# Patient Record
Sex: Female | Born: 1960 | Race: White | Hispanic: No | Marital: Single | Smoking: Never smoker
Health system: Southern US, Community
[De-identification: ages and names within clinical notes are randomized; demographics above are authoritative.]

## PROBLEM LIST (undated history)

## (undated) DIAGNOSIS — O2230 Deep phlebothrombosis in pregnancy, unspecified trimester: Secondary | ICD-10-CM

## (undated) DIAGNOSIS — R42 Dizziness and giddiness: Secondary | ICD-10-CM

## (undated) DIAGNOSIS — R569 Unspecified convulsions: Secondary | ICD-10-CM

## (undated) DIAGNOSIS — E119 Type 2 diabetes mellitus without complications: Secondary | ICD-10-CM

## (undated) DIAGNOSIS — I10 Essential (primary) hypertension: Secondary | ICD-10-CM

## (undated) DIAGNOSIS — G43909 Migraine, unspecified, not intractable, without status migrainosus: Secondary | ICD-10-CM

---

## 2003-01-22 DIAGNOSIS — O2230 Deep phlebothrombosis in pregnancy, unspecified trimester: Secondary | ICD-10-CM

## 2003-01-22 HISTORY — DX: Deep phlebothrombosis in pregnancy, unspecified trimester: O22.30

## 2003-01-22 HISTORY — PX: VASCULAR SURGERY: SHX849

## 2009-12-07 ENCOUNTER — Emergency Department: Payer: Self-pay | Admitting: Emergency Medicine

## 2009-12-10 ENCOUNTER — Ambulatory Visit: Payer: Self-pay | Admitting: Orthopedic Surgery

## 2013-12-07 ENCOUNTER — Ambulatory Visit: Payer: Self-pay | Admitting: Family Medicine

## 2013-12-22 ENCOUNTER — Ambulatory Visit: Payer: Self-pay | Admitting: Family Medicine

## 2014-02-16 ENCOUNTER — Ambulatory Visit: Payer: Self-pay | Admitting: Emergency Medicine

## 2014-02-16 LAB — PREGNANCY, URINE: Pregnancy Test, Urine: NEGATIVE m[IU]/mL

## 2014-02-24 ENCOUNTER — Ambulatory Visit: Payer: Self-pay | Admitting: Emergency Medicine

## 2014-04-14 ENCOUNTER — Ambulatory Visit: Payer: Self-pay | Admitting: Emergency Medicine

## 2015-04-04 ENCOUNTER — Other Ambulatory Visit: Payer: Self-pay | Admitting: Family Medicine

## 2015-04-04 DIAGNOSIS — Z1231 Encounter for screening mammogram for malignant neoplasm of breast: Secondary | ICD-10-CM

## 2015-04-27 ENCOUNTER — Ambulatory Visit: Payer: Self-pay | Attending: Family Medicine

## 2015-08-23 ENCOUNTER — Encounter: Payer: Self-pay | Admitting: Emergency Medicine

## 2015-08-23 ENCOUNTER — Emergency Department
Admission: EM | Admit: 2015-08-23 | Discharge: 2015-08-23 | Disposition: A | Discharge status: Home / Self Care | Payer: Worker's Compensation | Attending: Emergency Medicine | Admitting: Emergency Medicine

## 2015-08-23 ENCOUNTER — Emergency Department: Payer: Worker's Compensation

## 2015-08-23 DIAGNOSIS — S0083XA Contusion of other part of head, initial encounter: Secondary | ICD-10-CM | POA: Diagnosis not present

## 2015-08-23 DIAGNOSIS — W228XXA Striking against or struck by other objects, initial encounter: Secondary | ICD-10-CM | POA: Diagnosis not present

## 2015-08-23 DIAGNOSIS — Y990 Civilian activity done for income or pay: Secondary | ICD-10-CM | POA: Diagnosis not present

## 2015-08-23 DIAGNOSIS — S0093XA Contusion of unspecified part of head, initial encounter: Secondary | ICD-10-CM

## 2015-08-23 DIAGNOSIS — Y9389 Activity, other specified: Secondary | ICD-10-CM | POA: Diagnosis not present

## 2015-08-23 DIAGNOSIS — Y929 Unspecified place or not applicable: Secondary | ICD-10-CM | POA: Insufficient documentation

## 2015-08-23 DIAGNOSIS — S0990XA Unspecified injury of head, initial encounter: Secondary | ICD-10-CM | POA: Diagnosis present

## 2015-08-23 DIAGNOSIS — W19XXXA Unspecified fall, initial encounter: Secondary | ICD-10-CM

## 2015-08-23 DIAGNOSIS — I10 Essential (primary) hypertension: Secondary | ICD-10-CM | POA: Insufficient documentation

## 2015-08-23 DIAGNOSIS — E119 Type 2 diabetes mellitus without complications: Secondary | ICD-10-CM | POA: Diagnosis not present

## 2015-08-23 HISTORY — DX: Type 2 diabetes mellitus without complications: E11.9

## 2015-08-23 HISTORY — DX: Essential (primary) hypertension: I10

## 2015-08-23 HISTORY — DX: Unspecified convulsions: R56.9

## 2015-08-23 HISTORY — DX: Deep phlebothrombosis in pregnancy, unspecified trimester: O22.30

## 2015-08-23 MED ORDER — IBUPROFEN 800 MG PO TABS: 800.0000 mg | ORAL_TABLET | Freq: Three times a day (TID) | ORAL | 0 refills | Status: DC | PRN

## 2015-08-23 NOTE — ED Notes (Signed)
Pt employeed with [employer], [place] [phone]); spoke with Marylene Land, Education officer, community who reports no drug testing required; workers comp profile indicates drug testing only on request

## 2015-08-23 NOTE — ED Triage Notes (Signed)
While at work, putting a 20 foot ladder up, ladder started to fall back onto patient, patient fell back and hit back of head.  No LOC.

## 2015-08-23 NOTE — ED Notes (Addendum)
Pt states she was putting a ladder up, ladder felt towards her and pt fell backward. Pt hit head on cement, states hit middle posterior of head. Pt states when she moves she feels dizzy. States ladder also hit L lower leg, "but I don't think it did anything."

## 2015-08-23 NOTE — ED Notes (Signed)
Pt transported to CT via stretcher.  

## 2015-08-23 NOTE — ED Provider Notes (Signed)
[facility] Emergency Department Provider Note  ____________________________________________  Time seen: Approximately 8:40 PM  I have reviewed the triage vital signs and the nursing notes.   HISTORY  Chief Complaint Fall    HPI Ann Sanders is a 55 y.o. female who presents for evaluation of head injury. Patient states that she was putting up a ladder when she fell backwards and the ladder fell on top of her. She reports hitting her head directly onto the ground. Complains of a hematoma and bruising to the posterior aspect of her head. Also feels slightly dizzy.   Past Medical History:  Diagnosis Date  . Diabetes mellitus without complication (HCC)    pre diabetes  . DVT (deep vein thrombosis) in pregnancy   . Hypertension   . Seizures (HCC)     There are no active problems to display for this patient.   No past surgical history on file.  Prior to Admission medications   Medication Sig Start Date End Date Taking? Authorizing Provider  ibuprofen (ADVIL,MOTRIN) 800 MG tablet Take 1 tablet (800 mg total) by mouth every 8 (eight) hours as needed. 08/23/15   Evangeline Dakin, PA-C    Allergies Asa [aspirin] and Shellfish allergy  No family history on file.  Social History Social History  Substance Use Topics  . Smoking status: Never Smoker  . Smokeless tobacco: Never Used  . Alcohol use No    Review of Systems Constitutional: No fever/chills Eyes: No visual changes. ENT: No sore throat. Cardiovascular: Denies chest pain. Respiratory: Denies shortness of breath. Musculoskeletal: Negative for back pain. Skin: Negative for rash. Neurological: Positive for headaches, denies focal weakness or numbness. Positive for slightly dizzy.  10-point ROS otherwise negative.  ____________________________________________   PHYSICAL EXAM:  VITAL SIGNS: ED Triage Vitals [08/23/15 2022]  Enc Vitals Group     BP (!) 108/56     Pulse Rate  77     Resp 16     Temp 98 F (36.7 C)     Temp Source Oral     SpO2 99 %     Weight 214 lb (97.1 kg)     Height 5' (1.524 m)     Head Circumference      Peak Flow      Pain Score 0     Pain Loc      Pain Edu?      Excl. in GC?     Constitutional: Alert and oriented. Well appearing and in no acute distress. Eyes: Conjunctivae are normal. PERRL. EOMI. Head: Hematoma noted on the occipital posterior aspect of the scalp. No laceration noted. Tender to palpation. Neck: No stridor.  Supple, full range of motion nontender. Cardiovascular: Normal rate, regular rhythm. Grossly normal heart sounds.  Good peripheral circulation. Respiratory: Normal respiratory effort.  No retractions. Lungs CTAB. Musculoskeletal: No lower extremity tenderness nor edema.  No joint effusions. Neurologic:  Normal speech and language. No gross focal neurologic deficits are appreciated. No gait instability. Skin:  Skin is warm, dry and intact. No rash noted. Psychiatric: Mood and affect are normal. Speech and behavior are normal.  ____________________________________________   LABS (all labs ordered are listed, but only abnormal results are displayed)  Labs Reviewed - No data to display ____________________________________________  EKG   ____________________________________________  RADIOLOGY  No acute osseous or intracranial findings. Posterior hematoma noted. ____________________________________________   PROCEDURES  Procedure(s) performed: None  Critical Care performed: No  ____________________________________________   INITIAL IMPRESSION / ASSESSMENT  AND PLAN / ED COURSE  Pertinent labs & imaging results that were available during my care of the patient were reviewed by me and considered in my medical decision making (see chart for details).  Status post fall with head contusion. Rx given for Motrin 800 mg reassurance provided to the patient. Follow up with PCP or return to the ER  with any worsening or developing symptomology.  Clinical Course    ____________________________________________   FINAL CLINICAL IMPRESSION(S) / ED DIAGNOSES  Final diagnoses:  Fall, initial encounter  Head contusion, initial encounter     This chart was dictated using voice recognition software/Dragon. Despite best efforts to proofread, errors can occur which can change the meaning. Any change was purely unintentional.    Evangeline Dakin, PA-C 08/23/15 2131    Phineas Semen, MD 08/23/15 2155

## 2015-08-23 NOTE — ED Notes (Signed)
Pt states she does not have a ride until after 10pm

## 2015-08-28 ENCOUNTER — Ambulatory Visit
Admission: EM | Admit: 2015-08-28 | Discharge: 2015-08-28 | Disposition: A | Discharge status: Home / Self Care | Payer: Worker's Compensation | Attending: Internal Medicine | Admitting: Internal Medicine

## 2015-08-28 DIAGNOSIS — H6593 Unspecified nonsuppurative otitis media, bilateral: Secondary | ICD-10-CM

## 2015-08-28 DIAGNOSIS — H5212 Myopia, left eye: Secondary | ICD-10-CM | POA: Diagnosis not present

## 2015-08-28 DIAGNOSIS — S060X0D Concussion without loss of consciousness, subsequent encounter: Secondary | ICD-10-CM

## 2015-08-28 DIAGNOSIS — R42 Dizziness and giddiness: Secondary | ICD-10-CM

## 2015-08-28 DIAGNOSIS — E860 Dehydration: Secondary | ICD-10-CM

## 2015-08-28 LAB — GLUCOSE, CAPILLARY: Glucose-Capillary: 100 mg/dL — ABNORMAL HIGH (ref 65–99)

## 2015-08-28 MED ORDER — MECLIZINE HCL 25 MG PO TABS: 25.0000 mg | ORAL_TABLET | Freq: Three times a day (TID) | ORAL | 0 refills | Status: DC | PRN

## 2015-08-28 MED ORDER — ONDANSETRON 8 MG PO TBDP
8.0000 mg | ORAL_TABLET | Freq: Once | ORAL | Status: AC
  Administered 2015-08-28: 8 mg via ORAL

## 2015-08-28 NOTE — ED Provider Notes (Signed)
CSN: [id number]     Arrival date & time 08/28/15  1632 History   First MD Initiated Contact with Patient 08/28/15 1719     Chief Complaint  Patient presents with  . Head Injury    Dizziness   (Consider location/radiation/quality/duration/timing/severity/associated sxs/prior Treatment) Single caucasian female fell backwards hit head on ground while carrying ladder 08/23/2015 seen in ER at [facility] negative head CT.  Has still been having nausea, blurred vision, light sensitivity, diarrhea since incident.  HR Rep from [employer] drove her here to clinic today.  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 Diabetes mellitus without complication (HCC)    pre diabetes  . DVT (deep vein thrombosis) in pregnancy   . Hypertension   . Seizures (HCC)    History reviewed. No pertinent surgical history. History reviewed. No pertinent family history. Social History  Substance Use Topics  . Smoking status: Never Smoker  . Smokeless tobacco: Never Used  . Alcohol use No   OB History    No data available     Review of Systems  Constitutional: Negative for activity change, appetite change, chills, diaphoresis, fatigue, fever and unexpected weight change.  HENT: Negative for congestion, dental problem, drooling, ear discharge, ear pain, facial swelling, hearing loss, mouth sores, nosebleeds, postnasal drip, rhinorrhea, sinus pressure, sneezing, sore throat, tinnitus, trouble swallowing and voice change.   Eyes: Positive for photophobia. Negative for pain, discharge, redness, itching and visual disturbance.  Respiratory: Negative for cough, choking, chest tightness, shortness of breath, wheezing and stridor.   Cardiovascular: Negative for chest pain, palpitations and leg swelling.  Gastrointestinal: Positive for nausea. Negative for abdominal  distention, abdominal pain, blood in stool, constipation, diarrhea and vomiting.  Endocrine: Negative for cold intolerance and heat intolerance.  Genitourinary: Negative for difficulty urinating, dysuria and hematuria.  Musculoskeletal: Negative for arthralgias, back pain, gait problem, joint swelling, myalgias, neck pain and neck stiffness.  Skin: Negative for color change, pallor, rash and wound.  Allergic/Immunologic: Negative for environmental allergies and food allergies.  Neurological: Positive for dizziness and headaches. Negative for tremors, seizures, syncope, facial asymmetry, speech difficulty, weakness, light-headedness and numbness.  Hematological: Negative for adenopathy. Does not bruise/bleed easily.  Psychiatric/Behavioral: Negative for agitation, behavioral problems, confusion and sleep disturbance.    Allergies  Asa [aspirin] and Shellfish allergy  Home Medications   Prior to Admission medications   Medication Sig Start Date End Date Taking? Authorizing Provider  ascorbic acid (VITAMIN C) 1000 MG tablet Take 2,000 mg by mouth daily.   Yes Historical Provider, MD  Biotin 82956 MCG TABS Take by mouth.   Yes Historical Provider, MD  calcium carbonate (OSCAL) 1500 (600 Ca) MG TABS tablet Take by mouth 2 (two) times daily with a meal.   Yes Historical Provider, MD  carbamazepine (TEGRETOL) 200 MG tablet Take 200 mg by mouth 3 (three) times daily.   Yes Historical Provider, MD  cholecalciferol (VITAMIN D) 400 units TABS tablet Take 1,000 Units by mouth.   Yes Historical Provider, MD  CINNAMON PO Take by mouth.   Yes Historical Provider, MD  Cyanocobalamin (VITAMIN B 12 PO) Take 1,000 mcg by mouth.   Yes Historical Provider, MD  hydrochlorothiazide (HYDRODIURIL) 25 MG tablet Take 25 mg by mouth daily.   Yes Historical Provider, MD  lisinopril (PRINIVIL,ZESTRIL) 5 MG tablet Take 5 mg  by mouth daily.   Yes Historical Provider, MD  metFORMIN (GLUCOPHAGE) 500 MG tablet Take by mouth  2 (two) times daily with a meal.   Yes Historical Provider, MD  simvastatin (ZOCOR) 40 MG tablet Take 40 mg by mouth daily.   Yes Historical Provider, MD  SUMAtriptan (IMITREX) 100 MG tablet Take 100 mg by mouth every 2 (two) hours as needed for migraine. May repeat in 2 hours if headache persists or recurs.   Yes Historical Provider, MD  valproic acid (DEPAKENE) 250 MG capsule Take by mouth 4 (four) times daily.   Yes Historical Provider, MD  warfarin (COUMADIN) 10 MG tablet Take 15 mg by mouth daily.   Yes Historical Provider, MD  ibuprofen (ADVIL,MOTRIN) 800 MG tablet Take 1 tablet (800 mg total) by mouth every 8 (eight) hours as needed. 08/23/15   Charmayne Sheerharles M Beers, PA-C  meclizine (ANTIVERT) 25 MG tablet Take 1 tablet (25 mg total) by mouth 3 (three) times daily as needed for dizziness or nausea. 08/28/15   Barbaraann Barthelina A Caedyn Raygoza, NP   Meds Ordered and Administered this Visit   Medications  ondansetron (ZOFRAN-ODT) disintegrating tablet 8 mg (8 mg Oral Given 08/28/15 1743)    BP (!) 105/58 (BP Location: Left Arm)   Pulse 71   Temp 98 F (36.7 C) (Oral)   Resp 18   Ht 5' (1.524 m)   Wt 218 lb (98.9 kg)   SpO2 100%   BMI 42.58 kg/m  Orthostatic VS for the past 24 hrs:  BP- Lying Pulse- Lying BP- Sitting Pulse- Sitting BP- Standing at 0 minutes Pulse- Standing at 0 minutes  08/28/15 1855 105/63 52 96/52 54 110/65 60  08/28/15 1732 100/56 61 95/54 70 (!) 86/61 83    Physical Exam  Constitutional: She is oriented to person, place, and time. She appears well-developed and well-nourished. She is active and cooperative.  Non-toxic appearance. She does not have a sickly appearance. She does not appear ill. No distress.  HENT:  Head: Normocephalic and atraumatic.  Right Ear: Hearing, external ear and ear canal normal. A middle ear effusion is present.  Left Ear: Hearing, external ear and ear canal normal. A middle ear effusion is present.  Nose: No mucosal edema, rhinorrhea, nose lacerations, sinus  tenderness, nasal deformity, septal deviation or nasal septal hematoma. No epistaxis.  No foreign bodies. Right sinus exhibits no maxillary sinus tenderness and no frontal sinus tenderness. Left sinus exhibits no maxillary sinus tenderness and no frontal sinus tenderness.  Mouth/Throat: Uvula is midline and mucous membranes are normal. Mucous membranes are not pale, not dry and not cyanotic. She does not have dentures. No oral lesions. No trismus in the jaw. Normal dentition. No dental abscesses, uvula swelling, lacerations or dental caries. Posterior oropharyngeal edema and posterior oropharyngeal erythema present. No oropharyngeal exudate or tonsillar abscesses.  Eyes: Conjunctivae, EOM and lids are normal. Pupils are equal, round, and reactive to light. Right eye exhibits no chemosis, no discharge, no exudate and no hordeolum. No foreign body present in the right eye. Left eye exhibits no chemosis, no discharge, no exudate and no hordeolum. No foreign body present in the left eye. Right conjunctiva is not injected. Right conjunctiva has no hemorrhage. Left conjunctiva is not injected. Left conjunctiva has no hemorrhage. No scleral icterus. Right eye exhibits normal extraocular motion and no nystagmus. Left eye exhibits normal extraocular motion and no nystagmus. Right pupil is round and reactive. Left pupil is round and reactive. Pupils are equal.  Bilateral allergic shiners  Neck: Trachea normal and normal range of motion. Neck supple. No tracheal tenderness, no spinous process tenderness and no muscular tenderness present. No neck rigidity. No tracheal deviation, no edema, no erythema and normal range of motion present. No thyroid mass and no thyromegaly present.  Cardiovascular: Normal rate, regular rhythm, S1 normal, S2 normal, normal heart sounds and intact distal pulses.  PMI is not displaced.  Exam reveals no gallop and no friction rub.   No murmur heard. Pulmonary/Chest: Effort normal and breath  sounds normal. No accessory muscle usage or stridor. No respiratory distress. She has no decreased breath sounds. She has no wheezes. She has no rhonchi. She has no rales. She exhibits no tenderness.  Abdominal: Soft. She exhibits no distension.  Musculoskeletal: Normal range of motion. She exhibits no edema or tenderness.       Right shoulder: Normal.       Left shoulder: Normal.       Right hip: Normal.       Left hip: Normal.       Right knee: Normal.       Left knee: Normal.       Cervical back: Normal.       Right hand: Normal.       Left hand: Normal.  Lymphadenopathy:       Head (right side): No submental, no submandibular, no tonsillar, no preauricular, no posterior auricular and no occipital adenopathy present.       Head (left side): No submental, no submandibular, no tonsillar, no preauricular, no posterior auricular and no occipital adenopathy present.    She has no cervical adenopathy.       Right cervical: No superficial cervical, no deep cervical and no posterior cervical adenopathy present.      Left cervical: No superficial cervical, no deep cervical and no posterior cervical adenopathy present.  Neurological: She is alert and oriented to person, place, and time. She has normal strength. She is not disoriented. She displays no atrophy and no tremor. No cranial nerve deficit or sensory deficit. She exhibits normal muscle tone. She displays no seizure activity. Coordination and gait normal. GCS eye subscore is 4. GCS verbal subscore is 5. GCS motor subscore is 6.  Skin: Skin is warm, dry and intact. Capillary refill takes less than 2 seconds. No abrasion, no bruising, no burn, no ecchymosis, no laceration, no lesion, no petechiae and no rash noted. She is not diaphoretic. No cyanosis or erythema. No pallor. Nails show no clubbing.  Psychiatric: She has a normal mood and affect. Her speech is normal and behavior is normal. Judgment and thought content normal. Cognition and memory  are normal.  Nursing note and vitals reviewed.   Urgent Care Course   Clinical Course    Procedures (including critical care time)  Labs Review Labs Reviewed  GLUCOSE, CAPILLARY - Abnormal; Notable for the following:       Result Value   Glucose-Capillary 100 (*)    All other components within normal limits  CBG MONITORING, ED    Imaging Review No results found.   Visual Acuity Review  Right Eye Distance:  20/20 Left Eye Distance:   20/70 Bilateral Distance:  20/20  Right Eye Near:  20/20 Left Eye Near:   20/70 Bilateral Near:   20/20  Orthostatic VS for the past 24 hrs:  BP- Lying Pulse- Lying BP- Sitting Pulse- Sitting BP- Standing at 0 minutes Pulse- Standing at 0 minutes  08/28/15 1855  105/63 52 96/52 54 110/65 60  08/28/15 1732 100/56 61 95/54 70 (!) 86/61 83   Mild dehydration  Last optometry appt 2 years ago recommend repeat evaluation to ensure contacts correct Rx as 20/70 today.  Does not have glasses.  Trial of po fluids and zofran 8mg  ODT administered by RN San Jetty at  1743  Fingerstick 100. Patient verbalized understanding information/instructions, agreed with plan of care and had no further questions at this time.  1830 Patient still dizzy with sudden head movements refused meclizine.  Tolerated water and diet ginger ale without worsening nausea or vomiting.  Still no urge to urinate.  Repeat orthostatics pending.  Nausea improved after zofran po.  1850 urinated yellow gold urine per patient  Orthostatic VS for the past 24 hrs (Last 3 readings):  BP- Lying Pulse- Lying BP- Sitting Pulse- Sitting BP- Standing at 0 minutes Pulse- Standing at 0 minutes  08/28/15 1855 105/63 52 96/52 54 110/65 60  08/28/15 1732 100/56 61 95/54 70 (!) 86/61 83   1900 discussed orthostatics improved continue to rest and hydrate tonight.  Call and schedule appt with optometry tomorrow.  Follow up ER tonight if worst headache of life, visual disturbance,  vomiting.  Patient verbalized understanding information/instructions, agreed with plan of care and had no further questions at this time. MDM   1. Concussion, without loss of consciousness, subsequent encounter   2. Otitis media with effusion, bilateral   3. Dehydration, mild   4. Nearsightedness, left   5. Vertigo    Get lots of rest, get enough sleep.  Keep same bedtime weekends and weekdays.  Take daytime naps or rest breaks when you feels fatigued or tired, worsening symptoms.  Limit physical activity as well as activities that required a lot of thinking or concentration e.g. PE, sports practices, weight training, running, exercising, heavy lifting; thinking or concentration e.g. classwork or job related.  Red flags: headaches/neck pain that continue to worsen, seizures, drowsiness can't be awakened, repeated vomiting, slurred speech, can't recognize people or places, increasing confusion, weakness or numbness in arms or legs, unusual behavior change, increasing irritability, loss of consciousness, loss of bowel/bladder control, saddle paresthesias.  Work restrictions given avoid lifting greater than 5 lbs x 48 hours given.  Drink lots of fluids and eat carbs/protein to maintain blood glucose levels.  As symptoms decrease you may begin to gradually return to your daily activities.  If symptoms worsen or return lessen activities then try again to advance gradually.  During recovery normal to feel frustrated and sad when you do not feel right and can't be as active as usual.  Patient verbalized understanding of information/instructions, agreed with plan of care and had no further questions at this time.  Discussed with patient otitis media with effusion probably causing vertigo but could also be age.  Refused Meclizine in clinic wanted to go home.  Given Rx for home use/trial.  Supportive treatment may take up to 4 doses meclizine per day max 100mg  per 24 hours.  Coworker to drive her home recommended  not driving during vertigo episodes.  Follow up if aphasia, dysphasia, visual changes, weakness, fall, worst headache of life, incoordination, fever, ear discharge.  Consider ENT evaluation/follow up with PCM if worsening symptoms not controlled with meclizine or needs Rx refill.  Patient verbalized understanding of information/agreed with plan of care and had no further questions at this time.  Supportive treatment.   No evidence of invasive bacterial infection, non toxic and  well hydrated.  This is most likely self limiting viral infection.  I do not see where any further testing or imaging is necessary at this time.   I will suggest supportive care, rest, good hygiene and encourage the patient to take adequate fluids.  The patient is to return to clinic or EMERGENCY ROOM if symptoms worsen or change significantly e.g. ear pain, fever, purulent discharge from ears or bleeding.  Exitcare handout on otitis media with effusion given to patient.  Patient verbalized agreement and understanding of treatment plan.    Mild dehydration due to decreased appetite/post concussive syndrome.  Discussed with patient to push po fluids (water, broth, gatorade, pedialyte, ginger ale) to ensure urine pale yellow clear, mucous membranes wet/pink no white coating on tongue.  Prolonged dehydration could worsen diabetes blood sugar levels and cause acute renal insufficiency.  Follow up if lethargy, unable to urinate every 8 hours, brown/cola colored urine.  Exitcare handout on dehydration given to patient.  Patient verbalized understanding of information/instructions, agreed with plan of care and had no further questions at this time.  Exitcare on myopia given to patient.  Discussed diabetes, hypertension, dyslipidemia, age can all worsen vision. Due for routine exam every 2 years sooner if worsening.  Patient unsure what her vision was corrected to left eye with last contact Rx but knows she has monocular vision. May need new Rx.   Discussed with patient fall and hitting head could cause eye injury also but unlikely with her current symptoms but optometry to check her eye pressure, full slit lamp exam, etc ASAP.  Patient verbalized understanding information/instructions, agreed with plan of care and had no further questions at this time.    Barbaraann Barthel, NP 08/28/15 2016

## 2015-08-28 NOTE — ED Triage Notes (Addendum)
Last Wednesday night the patient was moving a ladder at work and the ladder fell on top of her and she hit her head on the concrete floor. Patient does say she saw a dark flash in when her head hit and her teeth popped.  She has been experiencing dizziness ever since the accident.

## 2016-03-29 ENCOUNTER — Emergency Department: Payer: BLUE CROSS/BLUE SHIELD

## 2016-03-29 ENCOUNTER — Emergency Department
Admission: EM | Admit: 2016-03-29 | Discharge: 2016-03-29 | Disposition: A | Discharge status: Home / Self Care | Payer: BLUE CROSS/BLUE SHIELD | Attending: Emergency Medicine | Admitting: Emergency Medicine

## 2016-03-29 ENCOUNTER — Encounter: Payer: Self-pay | Admitting: Emergency Medicine

## 2016-03-29 DIAGNOSIS — E119 Type 2 diabetes mellitus without complications: Secondary | ICD-10-CM | POA: Diagnosis not present

## 2016-03-29 DIAGNOSIS — Z7984 Long term (current) use of oral hypoglycemic drugs: Secondary | ICD-10-CM | POA: Diagnosis not present

## 2016-03-29 DIAGNOSIS — E860 Dehydration: Secondary | ICD-10-CM | POA: Insufficient documentation

## 2016-03-29 DIAGNOSIS — Z79899 Other long term (current) drug therapy: Secondary | ICD-10-CM | POA: Diagnosis not present

## 2016-03-29 DIAGNOSIS — R55 Syncope and collapse: Secondary | ICD-10-CM | POA: Insufficient documentation

## 2016-03-29 DIAGNOSIS — N9489 Other specified conditions associated with female genital organs and menstrual cycle: Secondary | ICD-10-CM | POA: Diagnosis not present

## 2016-03-29 DIAGNOSIS — Z7901 Long term (current) use of anticoagulants: Secondary | ICD-10-CM | POA: Insufficient documentation

## 2016-03-29 DIAGNOSIS — I10 Essential (primary) hypertension: Secondary | ICD-10-CM | POA: Diagnosis not present

## 2016-03-29 LAB — BASIC METABOLIC PANEL
Anion gap: 11 (ref 5–15)
BUN: 14 mg/dL (ref 6–20)
CO2: 28 mmol/L (ref 22–32)
Calcium: 9.5 mg/dL (ref 8.9–10.3)
Chloride: 99 mmol/L — ABNORMAL LOW (ref 101–111)
Creatinine, Ser: 1.25 mg/dL — ABNORMAL HIGH (ref 0.44–1.00)
GFR calc Af Amer: 56 mL/min — ABNORMAL LOW (ref >60)
GFR calc non Af Amer: 48 mL/min — ABNORMAL LOW (ref >60)
GLUCOSE: 120 mg/dL — AB (ref 65–99)
POTASSIUM: 3.7 mmol/L (ref 3.5–5.1)
Sodium: 138 mmol/L (ref 135–145)

## 2016-03-29 LAB — CBC
HEMATOCRIT: 35.7 % (ref 35.0–47.0)
Hemoglobin: 12.4 g/dL (ref 12.0–16.0)
MCH: 31.1 pg (ref 26.0–34.0)
MCHC: 34.8 g/dL (ref 32.0–36.0)
MCV: 89.4 fL (ref 80.0–100.0)
Platelets: 236 10*3/uL (ref 150–440)
RBC: 3.99 MIL/uL (ref 3.80–5.20)
RDW: 14.3 % (ref 11.5–14.5)
WBC: 8.5 10*3/uL (ref 3.6–11.0)

## 2016-03-29 LAB — PROTIME-INR
INR: 3.65
Prothrombin Time: 37.2 seconds — ABNORMAL HIGH (ref 11.4–15.2)

## 2016-03-29 LAB — HCG, QUANTITATIVE, PREGNANCY: hCG, Beta Chain, Quant, S: 3 m[IU]/mL (ref <5)

## 2016-03-29 MED ORDER — SODIUM CHLORIDE 0.9 % IV BOLUS (SEPSIS)
1000.0000 mL | Freq: Once | INTRAVENOUS | Status: AC
  Administered 2016-03-29: 1000 mL via INTRAVENOUS

## 2016-03-29 NOTE — Discharge Instructions (Signed)
Please make sure you remain well-hydrated. Turn to the emergency department for any new or worsening symptoms such as chest pain, shortness of breath, palpitations, or for any other concerns. Follow up with your primary care physician as needed.  Today your INR was too high. Please do not take your Coumadin today or tomorrow and make sure you get your INR rechecked in one week.  Restart your coumadin as normal in 2 days.  It was a pleasure to take care of you today, and thank you for coming to our emergency department.  If you have any questions or concerns before leaving please ask the nurse to grab me and I'm more than happy to go through your aftercare instructions again.  If you were prescribed any opioid pain medication today such as Norco, Vicodin, Percocet, morphine, hydrocodone, or oxycodone please make sure you do not drive when you are taking this medication as it can alter your ability to drive safely.  If you have any concerns once you are home that you are not improving or are in fact getting worse before you can make it to your follow-up appointment, please do not hesitate to call 911 and come back for further evaluation.  Merrily BrittleNeil Race Latour MD  Results for orders placed or performed during the hospital encounter of 03/29/16  Basic metabolic panel  Result Value Ref Range   Sodium 138 135 - 145 mmol/L   Potassium 3.7 3.5 - 5.1 mmol/L   Chloride 99 (L) 101 - 111 mmol/L   CO2 28 22 - 32 mmol/L   Glucose, Bld 120 (H) 65 - 99 mg/dL   BUN 14 6 - 20 mg/dL   Creatinine, Ser 1.191.25 (H) 0.44 - 1.00 mg/dL   Calcium 9.5 8.9 - 14.710.3 mg/dL   GFR calc non Af Amer 48 (L) >60 mL/min   GFR calc Af Amer 56 (L) >60 mL/min   Anion gap 11 5 - 15  CBC  Result Value Ref Range   WBC 8.5 3.6 - 11.0 K/uL   RBC 3.99 3.80 - 5.20 MIL/uL   Hemoglobin 12.4 12.0 - 16.0 g/dL   HCT 82.935.7 56.235.0 - 13.047.0 %   MCV 89.4 80.0 - 100.0 fL   MCH 31.1 26.0 - 34.0 pg   MCHC 34.8 32.0 - 36.0 g/dL   RDW 86.514.3 78.411.5 - 69.614.5 %   Platelets 236 150 - 440 K/uL  hCG, quantitative, pregnancy  Result Value Ref Range   hCG, Beta Chain, Quant, S 3 <5 mIU/mL  Protime-INR  Result Value Ref Range   Prothrombin Time 37.2 (H) 11.4 - 15.2 seconds   INR 3.65    Dg Chest Port 1 View  Result Date: 03/29/2016 CLINICAL DATA:  Near-syncope at work today, hypotensive. History of seizures, hypertension, diabetes. EXAM: PORTABLE CHEST 1 VIEW COMPARISON:  None. FINDINGS: Cardiomediastinal silhouette is normal. No pleural effusions or focal consolidations. Trachea projects midline and there is no pneumothorax. Soft tissue planes and included osseous structures are non-suspicious. IMPRESSION: Normal chest. Electronically Signed   By: Awilda Metroourtnay  Bloomer M.D.   On: 03/29/2016 19:47

## 2016-03-29 NOTE — ED Provider Notes (Signed)
[facility] Emergency Department Provider Note  ____________________________________________   First MD Initiated Contact with Patient 03/29/16 [phone]     (approximate)  I have reviewed the triage vital signs and the nursing notes.   HISTORY  Chief Complaint Near Syncope    HPI [name] is a 56 y.o. female who comes to the emergency department after a near syncopal event while at work today. 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 She became somewhat short of breath and nearly passed out. She said down and this helped her. A similar event happened 3 days ago while at work but she passed out completely. She has a past medical history of diabetes, hypertension, deep vein thrombus for which she takes warfarin. She denies chest pain. She reports compliance with her medications. She has never had a pulmonary embolism. She has no history of coronary artery disease. She has no headache no double vision or blurred vision. She currently feels well.   Past Medical History:  Diagnosis Date  . Diabetes mellitus without complication (HCC)    pre diabetes  . DVT (deep vein thrombosis) in pregnancy (HCC)   . Hypertension   . Seizures (HCC)     There are no active problems to display for this patient.   History reviewed. No pertinent surgical history.  Prior to Admission medications   Medication Sig Start Date End Date Taking? Authorizing Provider  ascorbic acid (VITAMIN C) 1000 MG tablet Take 1,000 mg by mouth daily.    Yes Historical Provider, MD  Biotin 96045 MCG TABS Take 10,000 mcg by mouth daily.    Yes Historical Provider, MD  Calcium Carbonate 500 MG CHEW Chew 500 mg by mouth daily.    Yes Historical Provider, MD  carbamazepine (TEGRETOL) 200 MG tablet Take 400 mg by mouth 2 (two) times daily.    Yes Historical Provider, MD  CINNAMON PO Take 1  tablet by mouth 2 (two) times daily.    Yes Historical Provider, MD  hydrochlorothiazide (HYDRODIURIL) 12.5 MG tablet Take 25 mg by mouth daily.   Yes Historical Provider, MD  ibuprofen (ADVIL,MOTRIN) 800 MG tablet Take 1 tablet (800 mg total) by mouth every 8 (eight) hours as needed. 08/23/15  Yes Charmayne Sheer Beers, PA-C  lisinopril (PRINIVIL,ZESTRIL) 10 MG tablet Take 5 mg by mouth daily.   Yes Historical Provider, MD  simvastatin (ZOCOR) 40 MG tablet Take 40 mg by mouth daily.   Yes Historical Provider, MD  SUMAtriptan (IMITREX) 100 MG tablet Take 100 mg by mouth every 2 (two) hours as needed for migraine. May repeat in 2 hours if headache persists or recurs.   Yes Historical Provider, MD  valproic acid (DEPAKENE) 250 MG capsule Take 500-750 mg by mouth 2 (two) times daily. Pt takes 3 caps qam and 2 qpm   Yes Historical Provider, MD  warfarin (COUMADIN) 10 MG tablet Take 15-17.5 mg by mouth daily. Pt takes 15 mg qd and takes an additional 2.5 mg on mondays and wednesdays to make 17.5 mg   Yes Historical Provider, MD  cholecalciferol (VITAMIN D) 400 units TABS tablet Take 1,000 Units by mouth.    Historical Provider, MD  Cyanocobalamin (VITAMIN B 12 PO) Take 1,000 mcg by mouth.    Historical Provider, MD  meclizine (ANTIVERT) 25 MG tablet Take 1 tablet (25 mg total) by mouth 3 (  three) times daily as needed for dizziness or nausea. Patient not taking: Reported on 03/29/2016 08/28/15   Barbaraann Barthelina A Betancourt, NP  metFORMIN (GLUCOPHAGE) 500 MG tablet Take by mouth 2 (two) times daily with a meal.    Historical Provider, MD    Allergies Asa [aspirin] and Shellfish allergy  History reviewed. No pertinent family history.  Social History Social History  Substance Use Topics  . Smoking status: Never Smoker  . Smokeless tobacco: Never Used  . Alcohol use No    Review of Systems Constitutional: No fever/chills Eyes: No visual changes. ENT: No sore throat. Cardiovascular: Denies chest pain. Respiratory:  Positive shortness of breath. Gastrointestinal: No abdominal pain.  Positive nausea, no vomiting.  No diarrhea.  No constipation. Genitourinary: Negative for dysuria. Musculoskeletal: Negative for back pain. Skin: Negative for rash. Neurological: Negative for headaches, focal weakness or numbness.  10-point ROS otherwise negative.  ____________________________________________   PHYSICAL EXAM:  VITAL SIGNS: ED Triage Vitals  Enc Vitals Group     BP 03/29/16 1912 103/61     Pulse Rate 03/29/16 1912 69     Resp 03/29/16 1912 12     Temp 03/29/16 1912 98.2 F (36.8 C)     Temp Source 03/29/16 1912 Oral     SpO2 03/29/16 1912 99 %     Weight 03/29/16 1913 204 lb (92.5 kg)     Height 03/29/16 1913 5' (1.524 m)     Head Circumference --      Peak Flow --      Pain Score 03/29/16 1914 0     Pain Loc --      Pain Edu? --      Excl. in GC? --     Constitutional: Alert and oriented x 4 well appearing nontoxic no diaphoresis speaks in full, clear sentences Eyes: PERRL EOMI. Head: Atraumatic. Nose: No congestion/rhinnorhea. Mouth/Throat: No trismus Neck: No stridor.   Cardiovascular: Normal rate, regular rhythm. Grossly normal heart sounds.  Good peripheral circulation. Respiratory: Normal respiratory effort.  No retractions. Lungs CTAB and moving good air Gastrointestinal: Soft nondistended nontender no rebound no guarding no peritonitis no McBurney's tenderness negative Rovsing's no costovertebral tenderness negative Murphy's Musculoskeletal: No lower extremity edema  Legs are equal in size no erythema and no cords Neurologic:  Normal speech and language. No gross focal neurologic deficits are appreciated. Skin:  Skin is warm, dry and intact. No rash noted. Psychiatric: Mood and affect are normal. Speech and behavior are normal.  ____________________________________________   LABS (all labs ordered are listed, but only abnormal results are displayed)  Labs Reviewed  BASIC  METABOLIC PANEL - Abnormal; Notable for the following:       Result Value   Chloride 99 (*)    Glucose, Bld 120 (*)    Creatinine, Ser 1.25 (*)    GFR calc non Af Amer 48 (*)    GFR calc Af Amer 56 (*)    All other components within normal limits  PROTIME-INR - Abnormal; Notable for the following:    Prothrombin Time 37.2 (*)    All other components within normal limits  CBC  HCG, QUANTITATIVE, PREGNANCY  URINALYSIS, COMPLETE (UACMP) WITH MICROSCOPIC  PREGNANCY, URINE  CBG MONITORING, ED   ____________________________________________  EKG  ED ECG REPORT I, Merrily BrittleNeil Nazifa Trinka, the attending physician, personally viewed and interpreted this ECG.  Date: 03/29/2016 Rate: 69 Rhythm: normal sinus rhythm QRS Axis: normal Intervals: normal ST/T Wave abnormalities: normal Conduction Disturbances: none Narrative Interpretation: unremarkable  ____________________________________________  RADIOLOGY  Chest x-ray with no acute disease ____________________________________________   PROCEDURES  Procedure(s) performed: no  Procedures  Critical Care performed: no  ____________________________________________   INITIAL IMPRESSION / ASSESSMENT AND PLAN / ED COURSE  Pertinent labs & imaging results that were available during my care of the patient were reviewed by me and considered in my medical decision making (see chart for details).  On arrival the patient is well-appearing with a near syncopal event that sounds like vasovagal and may be related to dehydration. She has no signs of right heart strain on her EKG and she has no heart murmur. IV fluids and labs are pending.     After fluids the patient's symptoms have improved. She is supratherapeutic on her Coumadin but not enough to reverse. I've advised her to not take tonight's dose and not take tomorrow's dose and then to restart as normal. She understands she needs her INR rechecked in one week. At this point she is medically  stable for outpatient management. ____________________________________________   FINAL CLINICAL IMPRESSION(S) / ED DIAGNOSES  Final diagnoses:  Near syncope  Dehydration      NEW MEDICATIONS STARTED DURING THIS VISIT:  New Prescriptions   No medications on file     Note:  This document was prepared using Dragon voice recognition software and may include unintentional dictation errors.     Merrily Brittle, MD 03/29/16 2052

## 2016-03-29 NOTE — ED Notes (Signed)
Discharge order acknowledged, awaiting the completion of fluid bolus; pt is comfortable, pt on the stretcher watching tv.

## 2016-03-29 NOTE — ED Triage Notes (Signed)
Pt presents to ED 17 via EMS c/o near syncopal episode while at work; per EMS pt's BP was 82/40, improved within minutes to 100/41, 100% on RA, CBG 113, respirations 18; at this time pt is awake, alert, and oriented x4; pt is able to speak in complete sentences, does not appear to be in distress at this time. Pt is on the stretcher watching tv

## 2016-03-29 NOTE — ED Notes (Signed)

## 2016-10-12 ENCOUNTER — Emergency Department
Admission: EM | Admit: 2016-10-12 | Discharge: 2016-10-12 | Disposition: A | Discharge status: Home / Self Care | Payer: BLUE CROSS/BLUE SHIELD | Attending: Emergency Medicine | Admitting: Emergency Medicine

## 2016-10-12 ENCOUNTER — Encounter: Payer: Self-pay | Admitting: Emergency Medicine

## 2016-10-12 DIAGNOSIS — Z79899 Other long term (current) drug therapy: Secondary | ICD-10-CM | POA: Diagnosis not present

## 2016-10-12 DIAGNOSIS — Z7901 Long term (current) use of anticoagulants: Secondary | ICD-10-CM | POA: Insufficient documentation

## 2016-10-12 DIAGNOSIS — Z7984 Long term (current) use of oral hypoglycemic drugs: Secondary | ICD-10-CM | POA: Diagnosis not present

## 2016-10-12 DIAGNOSIS — E119 Type 2 diabetes mellitus without complications: Secondary | ICD-10-CM | POA: Insufficient documentation

## 2016-10-12 DIAGNOSIS — E860 Dehydration: Secondary | ICD-10-CM

## 2016-10-12 DIAGNOSIS — R42 Dizziness and giddiness: Secondary | ICD-10-CM | POA: Diagnosis present

## 2016-10-12 DIAGNOSIS — I10 Essential (primary) hypertension: Secondary | ICD-10-CM | POA: Insufficient documentation

## 2016-10-12 LAB — GLUCOSE, CAPILLARY: Glucose-Capillary: 102 mg/dL — ABNORMAL HIGH (ref 65–99)

## 2016-10-12 NOTE — ED Triage Notes (Signed)
Patient was at work in the [employer]. Patient became acutely dizzy and nauseated and subsequently activated 911. Patient Is a+o x4. Patient denies adequate PO intake today

## 2016-10-12 NOTE — ED Provider Notes (Signed)
[facility] Emergency Department Provider Note  ____________________________________________  Time seen: Approximately 5:46 PM  I have reviewed the triage vital signs and the nursing notes.   HISTORY  Chief Complaint Dizziness    HPI Ann Sanders is a 56 y.o. female who complains of an episode of lightheadedness nausea today while working at [employer]. She reports having a very mild occipital headache last night, gradual onset, no aggravating or alleviating factors nonradiating, no associated vision changes paresthesias or weakness. He was still there this morning when she woke up, and she skipped breakfast and went to work. Being upright made her symptoms worse. She sat down and eventually laid down on the floor and eventually did feel better. She denies any acute worsening of her headache or other neurologic complaints. Denies chest pain shortness of breath abdominal pain or back pain. Denies any urinary symptoms. Reports compliance with all her medications. Her usual blood pressure is about 110 systolic.     Past Medical History:  Diagnosis Date  . Diabetes mellitus without complication (HCC)    pre diabetes  . DVT (deep vein thrombosis) in pregnancy (HCC)   . Hypertension   . Seizures (HCC)      There are no active problems to display for this patient.    History reviewed. No pertinent surgical history.   Prior to Admission medications   Medication Sig Start Date End Date Taking? Authorizing Provider  ascorbic acid (VITAMIN C) 1000 MG tablet Take 1,000 mg by mouth daily.     [provider]  Biotin 16109 MCG TABS Take 10,000 mcg by mouth daily.     [provider]  Calcium Carbonate 500 MG CHEW Chew 500 mg by mouth daily.     [provider]  carbamazepine (TEGRETOL) 200 MG tablet Take 400 mg by mouth 2 (two) times daily.     [provider]  cholecalciferol (VITAMIN D) 400 units TABS tablet Take 1,000  Units by mouth.    [provider]  CINNAMON PO Take 1 tablet by mouth 2 (two) times daily.     [provider]  Cyanocobalamin (VITAMIN B 12 PO) Take 1,000 mcg by mouth.    [provider]  hydrochlorothiazide (HYDRODIURIL) 12.5 MG tablet Take 25 mg by mouth daily.    [provider]  ibuprofen (ADVIL,MOTRIN) 800 MG tablet Take 1 tablet (800 mg total) by mouth every 8 (eight) hours as needed. 08/23/15   Beers, Charmayne Sheer, PA-C  lisinopril (PRINIVIL,ZESTRIL) 10 MG tablet Take 5 mg by mouth daily.    [provider]  meclizine (ANTIVERT) 25 MG tablet Take 1 tablet (25 mg total) by mouth 3 (three) times daily as needed for dizziness or nausea. Patient not taking: Reported on 03/29/2016 08/28/15   Barbaraann Barthel, NP  metFORMIN (GLUCOPHAGE) 500 MG tablet Take by mouth 2 (two) times daily with a meal.    [provider]  simvastatin (ZOCOR) 40 MG tablet Take 40 mg by mouth daily.    [provider]  SUMAtriptan (IMITREX) 100 MG tablet Take 100 mg by mouth every 2 (two) hours as needed for migraine. May repeat in 2 hours if headache persists or recurs.    [provider]  valproic acid (DEPAKENE) 250 MG capsule Take 500-750 mg by mouth 2 (two) times daily. Pt takes 3 caps qam and 2 qpm    [provider]  warfarin (COUMADIN) 10 MG tablet Take 15-17.5 mg by mouth daily. Pt  takes 15 mg qd and takes an additional 2.5 mg on mondays and wednesdays to make 17.5 mg    [provider]     Allergies Asa [aspirin] and Shellfish allergy   History reviewed. No pertinent family history.  Social History Social History  Substance Use Topics  . Smoking status: Never Smoker  . Smokeless tobacco: Never Used  . Alcohol use No    Review of Systems  Constitutional:   No fever or chills.  ENT:   No sore throat. No rhinorrhea. Cardiovascular:   No chest pain or syncope. Respiratory:   No dyspnea or cough. Gastrointestinal:    Negative for abdominal pain, vomiting and diarrhea.  Musculoskeletal:   Negative for focal pain or swelling All other systems reviewed and are negative except as documented above in ROS and HPI.  ____________________________________________   PHYSICAL EXAM:  VITAL SIGNS: ED Triage Vitals [10/12/16 1532]  Enc Vitals Group     BP 121/74     Pulse Rate 64     Resp 18     Temp 98.2 F (36.8 C)     Temp Source Oral     SpO2 98 %     Weight 210 lb (95.3 kg)     Height 5' (1.524 m)     Head Circumference      Peak Flow      Pain Score      Pain Loc      Pain Edu?      Excl. in GC?     Vital signs reviewed, nursing assessments reviewed.   Constitutional:   Alert and oriented. Well appearing and in no distress. Eyes:   No scleral icterus.  EOMI. No nystagmus. No conjunctival pallor. PERRL. ENT   Head:   Normocephalic and atraumatic.   Nose:   No congestion/rhinnorhea.    Mouth/Throat:   MMM, no pharyngeal erythema. No peritonsillar mass.    Neck:   No meningismus. Full ROM Hematological/Lymphatic/Immunilogical:   No cervical lymphadenopathy. Cardiovascular:   RRR. Symmetric bilateral radial and DP pulses.  No murmurs.  Respiratory:   Normal respiratory effort without tachypnea/retractions. Breath sounds are clear and equal bilaterally. No wheezes/rales/rhonchi. Gastrointestinal:   Soft and nontender. Non distended. There is no CVA tenderness.  No rebound, rigidity, or guarding. Genitourinary:   deferred Musculoskeletal:   Normal range of motion in all extremities. No joint effusions.  No lower extremity tenderness.  No edema. Neurologic:   Normal speech and language.  Motor grossly intact. No gross focal neurologic deficits are appreciated.  Skin:    Skin is warm, dry and intact. No rash noted.  No petechiae, purpura, or bullae.  ____________________________________________    LABS (pertinent positives/negatives) (all labs ordered are listed, but only  abnormal results are displayed) Labs Reviewed  CBG MONITORING, ED   ____________________________________________   EKG  interpreted by me  Date: 10/12/2016  Rate: 64  Rhythm: normal sinus rhythm  QRS Axis: normal  Intervals: normal  ST/T Wave abnormalities: normal  Conduction Disutrbances: none  Narrative Interpretation: unremarkable      ____________________________________________    RADIOLOGY  No results found.  ____________________________________________   PROCEDURES Procedures  ____________________________________________   INITIAL IMPRESSION / ASSESSMENT AND PLAN / ED COURSE  Pertinent labs & imaging results that were available during my care of the patient were reviewed by me and considered in my medical decision making (see chart for details).  patient well appearing no acute distress, presents with episode of lightheadedness appears  to be orthostatic dizziness, likely dehydrated.Considering the patient's symptoms, medical history, and physical examination today, I have low suspicion for ischemic stroke, intracranial hemorrhage, meningitis, encephalitis, carotid or vertebral dissection, venous sinus thrombosis, MS, intracranial hypertension, glaucoma, CRAO, CRVO, or temporal arteritis. Low suspicion for ACS PE dissection pneumonia urinary tract infection hyperglycemia DKA or sepsis.  patient given IV fluids, feeling back to normal. Suitable for discharge home and outpatient follow-up.      ____________________________________________   FINAL CLINICAL IMPRESSION(S) / ED DIAGNOSES  Final diagnoses:  Dehydration  Dizziness      New Prescriptions   No medications on file     Portions of this note were generated with dragon dictation software. Dictation errors may occur despite best attempts at proofreading.    Sharman Cheek, MD 10/12/16 1750

## 2017-05-06 ENCOUNTER — Encounter: Payer: Self-pay | Admitting: Emergency Medicine

## 2017-05-06 ENCOUNTER — Ambulatory Visit
Admission: EM | Admit: 2017-05-06 | Discharge: 2017-05-06 | Disposition: A | Discharge status: Home / Self Care | Payer: BLUE CROSS/BLUE SHIELD | Attending: Family Medicine | Admitting: Family Medicine

## 2017-05-06 ENCOUNTER — Ambulatory Visit (INDEPENDENT_AMBULATORY_CARE_PROVIDER_SITE_OTHER): Payer: BLUE CROSS/BLUE SHIELD

## 2017-05-06 ENCOUNTER — Other Ambulatory Visit: Payer: Self-pay

## 2017-05-06 DIAGNOSIS — R42 Dizziness and giddiness: Secondary | ICD-10-CM | POA: Diagnosis not present

## 2017-05-06 DIAGNOSIS — Z7984 Long term (current) use of oral hypoglycemic drugs: Secondary | ICD-10-CM | POA: Diagnosis not present

## 2017-05-06 DIAGNOSIS — M25512 Pain in left shoulder: Secondary | ICD-10-CM

## 2017-05-06 DIAGNOSIS — M25511 Pain in right shoulder: Secondary | ICD-10-CM

## 2017-05-06 DIAGNOSIS — S239XXA Sprain of unspecified parts of thorax, initial encounter: Secondary | ICD-10-CM

## 2017-05-06 DIAGNOSIS — S233XXA Sprain of ligaments of thoracic spine, initial encounter: Secondary | ICD-10-CM | POA: Insufficient documentation

## 2017-05-06 DIAGNOSIS — M5489 Other dorsalgia: Secondary | ICD-10-CM | POA: Diagnosis not present

## 2017-05-06 DIAGNOSIS — Z7901 Long term (current) use of anticoagulants: Secondary | ICD-10-CM | POA: Diagnosis not present

## 2017-05-06 DIAGNOSIS — Z79899 Other long term (current) drug therapy: Secondary | ICD-10-CM | POA: Diagnosis not present

## 2017-05-06 DIAGNOSIS — M542 Cervicalgia: Secondary | ICD-10-CM | POA: Diagnosis not present

## 2017-05-06 DIAGNOSIS — S139XXA Sprain of joints and ligaments of unspecified parts of neck, initial encounter: Secondary | ICD-10-CM | POA: Insufficient documentation

## 2017-05-06 DIAGNOSIS — M549 Dorsalgia, unspecified: Secondary | ICD-10-CM | POA: Diagnosis present

## 2017-05-06 DIAGNOSIS — Y92410 Unspecified street and highway as the place of occurrence of the external cause: Secondary | ICD-10-CM | POA: Insufficient documentation

## 2017-05-06 DIAGNOSIS — R55 Syncope and collapse: Secondary | ICD-10-CM

## 2017-05-06 LAB — GLUCOSE, CAPILLARY: Glucose-Capillary: 114 mg/dL — ABNORMAL HIGH (ref 65–99)

## 2017-05-06 MED ORDER — CYCLOBENZAPRINE HCL 10 MG PO TABS: 10.0000 mg | ORAL_TABLET | Freq: Every day | ORAL | 0 refills | Status: DC
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
## 2017-05-06 NOTE — Discharge Instructions (Signed)
Rest, ice/heat, tylenol/motrin
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
## 2017-05-20 ENCOUNTER — Ambulatory Visit: Payer: Self-pay | Admitting: Family Medicine

## 2017-05-20 VITALS — BP 117/81 | HR 90 | Temp 98.7°F | Wt 213.0 lb

## 2017-05-20 DIAGNOSIS — Z8669 Personal history of other diseases of the nervous system and sense organs: Secondary | ICD-10-CM

## 2017-05-20 DIAGNOSIS — Z7901 Long term (current) use of anticoagulants: Secondary | ICD-10-CM

## 2017-05-20 DIAGNOSIS — I10 Essential (primary) hypertension: Secondary | ICD-10-CM

## 2017-05-20 DIAGNOSIS — E119 Type 2 diabetes mellitus without complications: Secondary | ICD-10-CM

## 2017-05-20 DIAGNOSIS — R42 Dizziness and giddiness: Secondary | ICD-10-CM

## 2017-05-20 DIAGNOSIS — Z09 Encounter for follow-up examination after completed treatment for conditions other than malignant neoplasm: Secondary | ICD-10-CM

## 2017-05-20 DIAGNOSIS — R569 Unspecified convulsions: Secondary | ICD-10-CM

## 2017-05-20 MED ORDER — VALPROIC ACID 250 MG PO CAPS: 500.0000 mg | ORAL_CAPSULE | Freq: Two times a day (BID) | ORAL | 3 refills | Status: DC

## 2017-05-20 MED ORDER — HYDROCHLOROTHIAZIDE 12.5 MG PO TABS: 25.0000 mg | ORAL_TABLET | Freq: Every day | ORAL | 3 refills | Status: DC

## 2017-05-20 MED ORDER — APIXABAN 5 MG PO TABS: 5.0000 mg | ORAL_TABLET | Freq: Two times a day (BID) | ORAL | 3 refills | Status: DC

## 2017-05-20 MED ORDER — SIMVASTATIN 40 MG PO TABS: 40.0000 mg | ORAL_TABLET | Freq: Every day | ORAL | 3 refills | Status: DC

## 2017-05-20 MED ORDER — LISINOPRIL 10 MG PO TABS: 5.0000 mg | ORAL_TABLET | Freq: Every day | ORAL | 3 refills | Status: DC

## 2017-05-20 MED ORDER — MECLIZINE HCL 25 MG PO TABS: 25.0000 mg | ORAL_TABLET | Freq: Three times a day (TID) | ORAL | 3 refills | Status: AC | PRN

## 2017-05-20 MED ORDER — CARBAMAZEPINE 200 MG PO TABS: 400.0000 mg | ORAL_TABLET | Freq: Two times a day (BID) | ORAL | 3 refills | Status: DC

## 2017-05-20 MED ORDER — METFORMIN HCL 500 MG PO TABS: 500.0000 mg | ORAL_TABLET | Freq: Two times a day (BID) | ORAL | 3 refills | Status: DC

## 2017-05-20 NOTE — Progress Notes (Signed)
Patient: Ann Sanders Female    DOB: 09-21-1960   57 y.o.   MRN: [id number] Visit Date: 05/20/2017  Today's Provider: Kallie Locks, FNP   Chief Complaint  Patient presents with  . Medication Refill   Subjective:    HPI   Patient is here today for hospital follow up. She states that she is currently out of work. She is actively seeking employment, and waiting on unemployment. Needs to establish PCP and continue with refills on all medications.   She states that she has a history of seizures. Reports that her last seizure was at least 15 years ago.   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 She was placed on Eliquis 5 mg BID by her previous PCP, Fabienne Bruns, PA, at [facility] in [place], [place]. She had been actively following up with her regularly until she lost her job. She has history of Hypertension, Seizures, Migraine Headaches, and Diabetes.    Denies fevers, chills, fatigue, intentions weight loss, and night sweats. She does have occasional hot flashes. She denies cough, shortness of breath, heart palpitations, and chest pain. She has dizziness and occasional headaches. She denies visual changes, and falls. She denies falls. She has a good appetite. She has regular bowel movements. Denies abdominal pain, nausea, vomiting, diarrhea, and constipation. She denies any bleeding episodes.   She states that is not having any pain today.   Allergies  Allergen Reactions  . Asa [Aspirin] Anaphylaxis  . Shellfish Allergy Itching    Allergies as of 05/20/2017      Reactions   Asa [aspirin] Anaphylaxis   Shellfish Allergy Itching      Medication List        Accurate as of 05/20/17  9:59 PM. Always use your most recent med list.          apixaban 5 MG Tabs tablet Commonly known as:  ELIQUIS Take 1 tablet (5 mg total) by mouth 2 (two) times daily.   ascorbic acid 1000 MG tablet Commonly known as:  VITAMIN C Take 1,000 mg by  mouth daily.   Biotin 52841 MCG Tabs Take 10,000 mcg by mouth daily.   Calcium Carbonate 500 MG Chew Chew 500 mg by mouth daily.   carbamazepine 200 MG tablet Commonly known as:  TEGRETOL Take 2 tablets (400 mg total) by mouth 2 (two) times daily.   cholecalciferol 400 units Tabs tablet Commonly known as:  VITAMIN D Take 1,000 Units by mouth.   CINNAMON PO Take 1 tablet by mouth 2 (two) times daily.   cyclobenzaprine 10 MG tablet Commonly known as:  FLEXERIL Take 1 tablet (10 mg total) by mouth at bedtime.   hydrochlorothiazide 12.5 MG tablet Commonly known as:  HYDRODIURIL Take 2 tablets (25 mg total) by mouth daily.   ibuprofen 800 MG tablet Commonly known as:  ADVIL,MOTRIN Take 1 tablet (800 mg total) by mouth every 8 (eight) hours as needed.   lisinopril 10 MG tablet Commonly known as:  PRINIVIL,ZESTRIL Take 0.5 tablets (5 mg total) by mouth daily.   meclizine 25 MG tablet Commonly known as:  ANTIVERT Take 1 tablet (25 mg total) by mouth 3 (three) times daily as needed for up to 10 days for dizziness or nausea.   metFORMIN 500 MG tablet Commonly known as:  GLUCOPHAGE Take 1 tablet (500 mg total) by mouth 2 (two) times daily with a meal.   simvastatin 40 MG tablet Commonly known as:  ZOCOR Take 1 tablet (40 mg total) by mouth daily.   SUMAtriptan 100 MG tablet Commonly known as:  IMITREX Take 100 mg by mouth every 2 (two) hours as needed for migraine. May repeat in 2 hours if headache persists or recurs.   valproic acid 250 MG capsule Commonly known as:  DEPAKENE Take 2-3 capsules (500-750 mg total) by mouth 2 (two) times daily. Pt takes 3 caps qam and 2 qpm   VITAMIN B 12 PO Take 1,000 mcg by mouth.       Review of Systems  Constitutional: Negative.   HENT: Negative.   Eyes: Negative.   Respiratory: Negative.   Cardiovascular: Negative.   Gastrointestinal: Negative.   Endocrine: Negative.   Genitourinary: Negative.   Musculoskeletal: Negative.    Skin: Negative.   Allergic/Immunologic: Negative.   Neurological: Positive for dizziness and headaches.       Occasional  Hematological: Negative.   Psychiatric/Behavioral: Negative.     Social History   Tobacco Use  . Smoking status: Never Smoker  . Smokeless tobacco: Never Used  Substance Use Topics  . Alcohol use: No   Objective:   BP 117/81 (BP Location: Left Arm, Patient Position: Sitting)   Pulse 90   Temp 98.7 F (37.1 C)   Wt 213 lb (96.6 kg)   BMI 41.60 kg/m   Physical Exam  Constitutional: She is oriented to person, place, and time. She appears well-developed and well-nourished.  Eyes: Pupils are equal, round, and reactive to light. Conjunctivae and EOM are normal.  Neck: Normal range of motion. Neck supple.  Cardiovascular: Normal rate, regular rhythm, normal heart sounds and intact distal pulses.  Pulmonary/Chest: Effort normal and breath sounds normal.  Abdominal: Soft. Bowel sounds are normal.  Musculoskeletal: Normal range of motion.  Neurological: She is alert and oriented to person, place, and time.  Skin: Skin is warm and dry. Capillary refill takes less than 2 seconds.  Psychiatric: She has a normal mood and affect. Her behavior is normal. Judgment and thought content normal.  Nursing note and vitals reviewed.      Assessment & Plan:  1. Hospital discharge follow-up We will draw labs today. She will follow up for lab review in 1 week.   - CBC w/Diff - Comprehensive metabolic panel; Future - HgB A1c - Lipid Profile  2. Hypertension, unspecified type BP is stable at 117/81 today. Continue taking HCTZ and Lisinopril as directed.   3. Controlled type 2 diabetes mellitus without complication, without long-term current use of insulin (HCC) Recent Glucose level on 05/06/2017 was stable at 114. We will draw CMP and Hgb A1c today to re-evaluate. She is taking Glucophage daily as directed. Monitor.  - HgB A1c - Lipid Profile  4. Seizures  (HCC) Stable. She will continue Depakote and Tegretol as directed.  - CBC w/Diff - Comprehensive metabolic panel; Future  5. Dizziness No reports of dizziness today. Continue Meclizine as needed. We will continue to monitor.   6. History of migraine headaches Stable. No reports of headaches lately. She is taking Imitrex as needed for headaches. Monitor.  7. Encounter for current long-term use of anticoagulants She has recurrent history of lower extremity DVTs. Last DVT was 10/01/2009. She has no reports of lower extremity pain, swelling, and warmth to touch. She will continue Eliquis 5 mg BID as directed. Monitor.   8. Follow up Follow up in 1 week for lab review.   Meds ordered this encounter  Medications  . apixaban (ELIQUIS)  5 MG TABS tablet    Sig: Take 1 tablet (5 mg total) by mouth 2 (two) times daily.    Dispense:  60 tablet    Refill:  3  . carbamazepine (TEGRETOL) 200 MG tablet    Sig: Take 2 tablets (400 mg total) by mouth 2 (two) times daily.    Dispense:  60 tablet    Refill:  3  . hydrochlorothiazide (HYDRODIURIL) 12.5 MG tablet    Sig: Take 2 tablets (25 mg total) by mouth daily.    Dispense:  30 tablet    Refill:  3  . lisinopril (PRINIVIL,ZESTRIL) 10 MG tablet    Sig: Take 0.5 tablets (5 mg total) by mouth daily.    Dispense:  30 tablet    Refill:  3  . metFORMIN (GLUCOPHAGE) 500 MG tablet    Sig: Take 1 tablet (500 mg total) by mouth 2 (two) times daily with a meal.    Dispense:  60 tablet    Refill:  3  . simvastatin (ZOCOR) 40 MG tablet    Sig: Take 1 tablet (40 mg total) by mouth daily.    Dispense:  30 tablet    Refill:  3  . valproic acid (DEPAKENE) 250 MG capsule    Sig: Take 2-3 capsules (500-750 mg total) by mouth 2 (two) times daily. Pt takes 3 caps qam and 2 qpm    Dispense:  60 capsule    Refill:  3  . meclizine (ANTIVERT) 25 MG tablet    Sig: Take 1 tablet (25 mg total) by mouth 3 (three) times daily as needed for up to 10 days for dizziness  or nausea.    Dispense:  90 tablet    Refill:  3     Kallie Locks, FNP   [facility]

## 2017-05-21 ENCOUNTER — Ambulatory Visit: Payer: Self-pay | Admitting: Internal Medicine

## 2017-05-21 ENCOUNTER — Telehealth: Payer: Self-pay | Admitting: Pharmacy Technician

## 2017-05-21 NOTE — Telephone Encounter (Signed)
Patient eligible to receive medication assistance at [facility] through 2019, as long as eligibility requirements continue to be met.  [facility]

## 2017-05-23 LAB — CBC WITH DIFFERENTIAL/PLATELET
Basophils Absolute: 0 10*3/uL (ref 0.0–0.2)
Basos: 0 %
EOS (ABSOLUTE): 0.2 10*3/uL (ref 0.0–0.4)
Eos: 2 %
Hematocrit: 37.5 % (ref 34.0–46.6)
Hemoglobin: 12.5 g/dL (ref 11.1–15.9)
Immature Grans (Abs): 0.1 10*3/uL (ref 0.0–0.1)
Immature Granulocytes: 1 %
Lymphocytes Absolute: 2.8 10*3/uL (ref 0.7–3.1)
Lymphs: 30 %
MCH: 30.1 pg (ref 26.6–33.0)
MCHC: 33.3 g/dL (ref 31.5–35.7)
MCV: 90 fL (ref 79–97)
Monocytes Absolute: 0.6 10*3/uL (ref 0.1–0.9)
Monocytes: 6 %
Neutrophils Absolute: 5.9 10*3/uL (ref 1.4–7.0)
Neutrophils: 61 %
Platelets: 251 10*3/uL (ref 150–379)
RBC: 4.15 x10E6/uL (ref 3.77–5.28)
RDW: 14.1 % (ref 12.3–15.4)
WBC: 9.6 10*3/uL (ref 3.4–10.8)

## 2017-05-23 LAB — HEMOGLOBIN A1C
Est. average glucose Bld gHb Est-mCnc: 114 mg/dL
Hgb A1c MFr Bld: 5.6 % (ref 4.8–5.6)

## 2017-05-23 LAB — LIPID PANEL
Chol/HDL Ratio: 3 ratio (ref 0.0–4.4)
Cholesterol, Total: 196 mg/dL (ref 100–199)
HDL: 66 mg/dL (ref >39)
LDL Calculated: 98 mg/dL (ref 0–99)
Triglycerides: 162 mg/dL — ABNORMAL HIGH (ref 0–149)
VLDL Cholesterol Cal: 32 mg/dL (ref 5–40)

## 2017-05-24 LAB — COMPREHENSIVE METABOLIC PANEL
ALT: 12 IU/L (ref 0–32)
AST: 12 IU/L (ref 0–40)
Albumin/Globulin Ratio: 1.9 (ref 1.2–2.2)
Albumin: 4.2 g/dL (ref 3.5–5.5)
Alkaline Phosphatase: 51 IU/L (ref 39–117)
BUN/Creatinine Ratio: 21 (ref 9–23)
BUN: 14 mg/dL (ref 6–24)
Bilirubin Total: <0.2 mg/dL (ref 0.0–1.2)
CO2: 24 mmol/L (ref 20–29)
Calcium: 9.5 mg/dL (ref 8.7–10.2)
Chloride: 95 mmol/L — ABNORMAL LOW (ref 96–106)
Creatinine, Ser: 0.66 mg/dL (ref 0.57–1.00)
GFR calc Af Amer: 116 mL/min/{1.73_m2} (ref >59)
GFR calc non Af Amer: 100 mL/min/{1.73_m2} (ref >59)
Globulin, Total: 2.2 g/dL (ref 1.5–4.5)
Glucose: 121 mg/dL — ABNORMAL HIGH (ref 65–99)
Potassium: 4.2 mmol/L (ref 3.5–5.2)
Sodium: 138 mmol/L (ref 134–144)
Total Protein: 6.4 g/dL (ref 6.0–8.5)

## 2017-05-27 ENCOUNTER — Ambulatory Visit: Payer: Self-pay

## 2017-05-27 ENCOUNTER — Telehealth: Payer: Self-pay | Admitting: Adult Health Nurse Practitioner

## 2017-05-27 NOTE — Telephone Encounter (Signed)
Patient called to cancel appointment. Would like to reschedule. Have NOT called to reschedule yet.

## 2017-06-04 ENCOUNTER — Ambulatory Visit: Payer: Self-pay | Admitting: Internal Medicine

## 2017-07-07 ENCOUNTER — Other Ambulatory Visit: Payer: Self-pay | Admitting: Internal Medicine

## 2017-07-08 ENCOUNTER — Other Ambulatory Visit: Payer: Self-pay

## 2017-07-08 MED ORDER — VALPROIC ACID 250 MG PO CAPS: ORAL_CAPSULE | ORAL | 0 refills | Status: DC

## 2017-07-18 ENCOUNTER — Telehealth: Payer: Self-pay | Admitting: Pharmacist

## 2017-07-18 NOTE — Telephone Encounter (Signed)
07/18/17 I have received the patient portion back for Lantus Solostar, hold for provider to return their portion-[facility].Forde RadonAJ

## 2017-07-18 NOTE — Telephone Encounter (Signed)
07/18/2017 3:22:46 PM - Eliquis  07/18/17 Faxed [employer] [facility] application for Eliquis 5mg  Take one tablet by mouth 2 times a day.Forde RadonAJ

## 2017-07-29 ENCOUNTER — Other Ambulatory Visit: Payer: Self-pay

## 2017-07-29 ENCOUNTER — Telehealth: Payer: Self-pay

## 2017-07-29 MED ORDER — VALPROIC ACID 250 MG PO CAPS: ORAL_CAPSULE | ORAL | 0 refills | Status: DC

## 2017-07-29 NOTE — Telephone Encounter (Signed)
Pt called re get a wrong strength on a medication. Lm for pt to call back re: name of medication

## 2017-08-01 ENCOUNTER — Other Ambulatory Visit: Payer: Self-pay

## 2017-08-01 DIAGNOSIS — I10 Essential (primary) hypertension: Principal | ICD-10-CM

## 2017-08-01 DIAGNOSIS — I152 Hypertension secondary to endocrine disorders: Secondary | ICD-10-CM

## 2017-08-01 DIAGNOSIS — E1159 Type 2 diabetes mellitus with other circulatory complications: Secondary | ICD-10-CM

## 2017-08-01 MED ORDER — LISINOPRIL 10 MG PO TABS: 10.0000 mg | ORAL_TABLET | Freq: Every day | ORAL | 3 refills | Status: DC

## 2017-08-05 ENCOUNTER — Other Ambulatory Visit: Payer: Self-pay | Admitting: Family Medicine

## 2017-09-01 ENCOUNTER — Other Ambulatory Visit: Payer: Self-pay | Admitting: Internal Medicine

## 2017-09-01 ENCOUNTER — Other Ambulatory Visit: Payer: Self-pay | Admitting: Adult Health Nurse Practitioner

## 2017-10-06 ENCOUNTER — Other Ambulatory Visit: Payer: Self-pay | Admitting: Adult Health Nurse Practitioner

## 2017-10-22 ENCOUNTER — Other Ambulatory Visit: Payer: Self-pay

## 2017-10-22 DIAGNOSIS — E119 Type 2 diabetes mellitus without complications: Secondary | ICD-10-CM

## 2017-10-22 MED ORDER — METFORMIN HCL 500 MG PO TABS: 500.0000 mg | ORAL_TABLET | Freq: Two times a day (BID) | ORAL | 3 refills | Status: DC

## 2017-11-03 ENCOUNTER — Other Ambulatory Visit: Payer: Self-pay | Admitting: Adult Health Nurse Practitioner

## 2017-11-12 ENCOUNTER — Other Ambulatory Visit: Payer: Self-pay

## 2017-11-12 ENCOUNTER — Ambulatory Visit: Payer: Self-pay | Admitting: Gerontology

## 2017-11-12 ENCOUNTER — Encounter: Payer: Self-pay | Admitting: Gerontology

## 2017-11-12 VITALS — BP 117/73 | HR 72 | Resp 14 | Ht 60.0 in | Wt 201.6 lb

## 2017-11-12 DIAGNOSIS — R569 Unspecified convulsions: Secondary | ICD-10-CM

## 2017-11-12 DIAGNOSIS — E785 Hyperlipidemia, unspecified: Secondary | ICD-10-CM

## 2017-11-12 DIAGNOSIS — Z7901 Long term (current) use of anticoagulants: Secondary | ICD-10-CM

## 2017-11-12 DIAGNOSIS — I10 Essential (primary) hypertension: Secondary | ICD-10-CM

## 2017-11-12 DIAGNOSIS — G43109 Migraine with aura, not intractable, without status migrainosus: Secondary | ICD-10-CM

## 2017-11-12 DIAGNOSIS — R42 Dizziness and giddiness: Secondary | ICD-10-CM

## 2017-11-12 DIAGNOSIS — Z8669 Personal history of other diseases of the nervous system and sense organs: Secondary | ICD-10-CM

## 2017-11-12 DIAGNOSIS — E1169 Type 2 diabetes mellitus with other specified complication: Secondary | ICD-10-CM

## 2017-11-12 DIAGNOSIS — E119 Type 2 diabetes mellitus without complications: Secondary | ICD-10-CM

## 2017-11-12 DIAGNOSIS — Z0000 Encounter for general adult medical examination without abnormal findings: Secondary | ICD-10-CM

## 2017-11-12 DIAGNOSIS — E1159 Type 2 diabetes mellitus with other circulatory complications: Secondary | ICD-10-CM

## 2017-11-12 MED ORDER — HYDROCHLOROTHIAZIDE 25 MG PO TABS: 25.0000 mg | ORAL_TABLET | Freq: Every day | ORAL | 0 refills | Status: DC

## 2017-11-12 MED ORDER — CALCIUM CARBONATE 500 MG PO CHEW: 500.0000 mg | CHEWABLE_TABLET | Freq: Every day | ORAL | 2 refills | Status: AC

## 2017-11-12 MED ORDER — APIXABAN 5 MG PO TABS: 5.0000 mg | ORAL_TABLET | Freq: Two times a day (BID) | ORAL | 3 refills | Status: DC

## 2017-11-12 MED ORDER — SUMATRIPTAN SUCCINATE 100 MG PO TABS: 100.0000 mg | ORAL_TABLET | Freq: Once | ORAL | 0 refills | Status: AC

## 2017-11-12 MED ORDER — CARBAMAZEPINE 200 MG PO TABS: ORAL_TABLET | ORAL | 0 refills | Status: DC

## 2017-11-12 MED ORDER — CHOLECALCIFEROL 10 MCG (400 UNIT) PO TABS: 1000.0000 [IU] | ORAL_TABLET | Freq: Every day | ORAL | 2 refills | Status: AC

## 2017-11-12 MED ORDER — METFORMIN HCL 500 MG PO TABS: 500.0000 mg | ORAL_TABLET | Freq: Two times a day (BID) | ORAL | 3 refills | Status: DC

## 2017-11-12 MED ORDER — BIOTIN 10000 MCG PO TABS: 10000.0000 ug | ORAL_TABLET | Freq: Every day | ORAL | 2 refills | Status: AC

## 2017-11-12 MED ORDER — SIMVASTATIN 40 MG PO TABS: 40.0000 mg | ORAL_TABLET | Freq: Every day | ORAL | 3 refills | Status: DC

## 2017-11-12 MED ORDER — ASCORBIC ACID 1000 MG PO TABS: 1000.0000 mg | ORAL_TABLET | Freq: Every day | ORAL | 2 refills | Status: AC

## 2017-11-12 MED ORDER — LISINOPRIL 10 MG PO TABS: 10.0000 mg | ORAL_TABLET | Freq: Every day | ORAL | 3 refills | Status: DC

## 2017-11-12 MED ORDER — VALPROIC ACID 250 MG PO CAPS: ORAL_CAPSULE | ORAL | 3 refills | Status: DC

## 2017-11-12 NOTE — Patient Instructions (Addendum)
Influenza Virus Vaccine (Flucelvax) What is this medicine? INFLUENZA VIRUS VACCINE (in floo EN zuh VAHY ruhs vak SEEN) helps to reduce the risk of getting influenza also known as the flu. The vaccine only helps protect you against some strains of the flu. This medicine may be used for other purposes; ask your health care provider or pharmacist if you have questions. COMMON BRAND NAME(S): FLUCELVAX What should I tell my health care provider before I take this medicine? They need to know if you have any of these conditions: -bleeding disorder like hemophilia -fever or infection -Guillain-Barre syndrome or other neurological problems -immune system problems -infection with the human immunodeficiency virus (HIV) or AIDS -low blood platelet counts -multiple sclerosis -an unusual or allergic reaction to influenza virus vaccine, other medicines, foods, dyes or preservatives -pregnant or trying to get pregnant -breast-feeding How should I use this medicine? This vaccine is for injection into a muscle. It is given by a health care professional. A copy of Vaccine Information Statements will be given before each vaccination. Read this sheet carefully each time. The sheet may change frequently. Talk to your pediatrician regarding the use of this medicine in children. Special care may be needed. Overdosage: If you think you've taken too much of this medicine contact a poison control center or emergency room at once. Overdosage: If you think you have taken too much of this medicine contact a poison control center or emergency room at once. NOTE: This medicine is only for you. Do not share this medicine with others. What if I miss a dose? This does not apply. What may interact with this medicine? -chemotherapy or radiation therapy -medicines that lower your immune system like etanercept, anakinra, infliximab, and adalimumab -medicines that treat or prevent blood clots like  warfarin -phenytoin -steroid medicines like prednisone or cortisone -theophylline -vaccines This list may not describe all possible interactions. Give your health care provider a list of all the medicines, herbs, non-prescription drugs, or dietary supplements you use. Also tell them if you smoke, drink alcohol, or use illegal drugs. Some items may interact with your medicine. What should I watch for while using this medicine? Report any side effects that do not go away within 3 days to your doctor or health care professional. Call your health care provider if any unusual symptoms occur within 6 weeks of receiving this vaccine. You may still catch the flu, but the illness is not usually as bad. You cannot get the flu from the vaccine. The vaccine will not protect against colds or other illnesses that may cause fever. The vaccine is needed every year. What side effects may I notice from receiving this medicine? Side effects that you should report to your doctor or health care professional as soon as possible: -allergic reactions like skin rash, itching or hives, swelling of the face, lips, or tongue Side effects that usually do not require medical attention (Report these to your doctor or health care professional if they continue or are bothersome.): -fever -headache -muscle aches and pains -pain, tenderness, redness, or swelling at the injection site -tiredness This list may not describe all possible side effects. Call your doctor for medical advice about side effects. You may report side effects to FDA at 1-800-FDA-1088. Where should I keep my medicine? The vaccine will be given by a health care professional in a clinic, pharmacy, doctor's office, or other health care setting. You will not be given vaccine doses to store at home. NOTE: This sheet is a summary.   It may not cover all possible information. If you have questions about this medicine, talk to your doctor, pharmacist, or health care  provider.  2018 Elsevier/Gold Standard (2010-12-19 14:06:47)  DASH Eating Plan DASH stands for "Dietary Approaches to Stop Hypertension." The DASH eating plan is a healthy eating plan that has been shown to reduce high blood pressure (hypertension). It may also reduce your risk for type 2 diabetes, heart disease, and stroke. The DASH eating plan may also help with weight loss. What are tips for following this plan? General guidelines  Avoid eating more than 2,300 mg (milligrams) of salt (sodium) a day. If you have hypertension, you may need to reduce your sodium intake to 1,500 mg a day.  Limit alcohol intake to no more than 1 drink a day for nonpregnant women and 2 drinks a day for men. One drink equals 12 oz of beer, 5 oz of wine, or 1 oz of hard liquor.  Work with your health care provider to maintain a healthy body weight or to lose weight. Ask what an ideal weight is for you.  Get at least 30 minutes of exercise that causes your heart to beat faster (aerobic exercise) most days of the week. Activities may include walking, swimming, or biking.  Work with your health care provider or diet and nutrition specialist (dietitian) to adjust your eating plan to your individual calorie needs. Reading food labels  Check food labels for the amount of sodium per serving. Choose foods with less than 5 percent of the Daily Value of sodium. Generally, foods with less than 300 mg of sodium per serving fit into this eating plan.  To find whole grains, look for the word "whole" as the first word in the ingredient list. Shopping  Buy products labeled as "low-sodium" or "no salt added."  Buy fresh foods. Avoid canned foods and premade or frozen meals. Cooking  Avoid adding salt when cooking. Use salt-free seasonings or herbs instead of table salt or sea salt. Check with your health care provider or pharmacist before using salt substitutes.  Do not fry foods. Cook foods using healthy methods such as  baking, boiling, grilling, and broiling instead.  Cook with heart-healthy oils, such as olive, canola, soybean, or sunflower oil. Meal planning   Eat a balanced diet that includes: ? 5 or more servings of fruits and vegetables each day. At each meal, try to fill half of your plate with fruits and vegetables. ? Up to 6-8 servings of whole grains each day. ? Less than 6 oz of lean meat, poultry, or fish each day. A 3-oz serving of meat is about the same size as a deck of cards. One egg equals 1 oz. ? 2 servings of low-fat dairy each day. ? A serving of nuts, seeds, or beans 5 times each week. ? Heart-healthy fats. Healthy fats called Omega-3 fatty acids are found in foods such as flaxseeds and coldwater fish, like sardines, salmon, and mackerel.  Limit how much you eat of the following: ? Canned or prepackaged foods. ? Food that is high in trans fat, such as fried foods. ? Food that is high in saturated fat, such as fatty meat. ? Sweets, desserts, sugary drinks, and other foods with added sugar. ? Full-fat dairy products.  Do not salt foods before eating.  Try to eat at least 2 vegetarian meals each week.  Eat more home-cooked food and less restaurant, buffet, and fast food.  When eating at a restaurant, ask that  your food be prepared with less salt or no salt, if possible. What foods are recommended? The items listed may not be a complete list. Talk with your dietitian about what dietary choices are best for you. Grains Whole-grain or whole-wheat bread. Whole-grain or whole-wheat pasta. Brown rice. Orpah Cobb. Bulgur. Whole-grain and low-sodium cereals. Pita bread. Low-fat, low-sodium crackers. Whole-wheat flour tortillas. Vegetables Fresh or frozen vegetables (raw, steamed, roasted, or grilled). Low-sodium or reduced-sodium tomato and vegetable juice. Low-sodium or reduced-sodium tomato sauce and tomato paste. Low-sodium or reduced-sodium canned vegetables. Fruits All fresh,  dried, or frozen fruit. Canned fruit in natural juice (without added sugar). Meat and other protein foods Skinless chicken or [place]. Ground chicken or [place]. Pork with fat trimmed off. Fish and seafood. Egg whites. Dried beans, peas, or lentils. Unsalted nuts, nut butters, and seeds. Unsalted canned beans. Lean cuts of beef with fat trimmed off. Low-sodium, lean deli meat. Dairy Low-fat (1%) or fat-free (skim) milk. Fat-free, low-fat, or reduced-fat cheeses. Nonfat, low-sodium ricotta or cottage cheese. Low-fat or nonfat yogurt. Low-fat, low-sodium cheese. Fats and oils Soft margarine without trans fats. Vegetable oil. Low-fat, reduced-fat, or light mayonnaise and salad dressings (reduced-sodium). Canola, safflower, olive, soybean, and sunflower oils. Avocado. Seasoning and other foods Herbs. Spices. Seasoning mixes without salt. Unsalted popcorn and pretzels. Fat-free sweets. What foods are not recommended? The items listed may not be a complete list. Talk with your dietitian about what dietary choices are best for you. Grains Baked goods made with fat, such as croissants, muffins, or some breads. Dry pasta or rice meal packs. Vegetables Creamed or fried vegetables. Vegetables in a cheese sauce. Regular canned vegetables (not low-sodium or reduced-sodium). Regular canned tomato sauce and paste (not low-sodium or reduced-sodium). Regular tomato and vegetable juice (not low-sodium or reduced-sodium). Rosita Fire. Olives. Fruits Canned fruit in a light or heavy syrup. Fried fruit. Fruit in cream or butter sauce. Meat and other protein foods Fatty cuts of meat. Ribs. Fried meat. Tomasa Blase. Sausage. Bologna and other processed lunch meats. Salami. Fatback. Hotdogs. Bratwurst. Salted nuts and seeds. Canned beans with added salt. Canned or smoked fish. Whole eggs or egg yolks. Chicken or [place] with skin. Dairy Whole or 2% milk, cream, and half-and-half. Whole or full-fat cream cheese. Whole-fat or sweetened  yogurt. Full-fat cheese. Nondairy creamers. Whipped toppings. Processed cheese and cheese spreads. Fats and oils Butter. Stick margarine. Lard. Shortening. Ghee. Bacon fat. Tropical oils, such as coconut, palm kernel, or palm oil. Seasoning and other foods Salted popcorn and pretzels. Onion salt, garlic salt, seasoned salt, table salt, and sea salt. Worcestershire sauce. Tartar sauce. Barbecue sauce. Teriyaki sauce. Soy sauce, including reduced-sodium. Steak sauce. Canned and packaged gravies. Fish sauce. Oyster sauce. Cocktail sauce. Horseradish that you find on the shelf. Ketchup. Mustard. Meat flavorings and tenderizers. Bouillon cubes. Hot sauce and Tabasco sauce. Premade or packaged marinades. Premade or packaged taco seasonings. Relishes. Regular salad dressings. Where to find more information:  National Heart, Lung, and Blood Institute: PopSteam.is  American Heart Association: www.heart.org Summary  The DASH eating plan is a healthy eating plan that has been shown to reduce high blood pressure (hypertension). It may also reduce your risk for type 2 diabetes, heart disease, and stroke.  With the DASH eating plan, you should limit salt (sodium) intake to 2,300 mg a day. If you have hypertension, you may need to reduce your sodium intake to 1,500 mg a day.  When on the DASH eating plan, aim to eat more fresh fruits and  vegetables, whole grains, lean proteins, low-fat dairy, and heart-healthy fats.  Work with your health care provider or diet and nutrition specialist (dietitian) to adjust your eating plan to your individual calorie needs. This information is not intended to replace advice given to you by your health care provider. Make sure you discuss any questions you have with your health care provider. Document Released: 12/27/2010 Document Revised: 01/01/2016 Document Reviewed: 01/01/2016 Elsevier Interactive Patient Education  Ann Sanders.

## 2017-11-12 NOTE — Progress Notes (Signed)
Patient: Ann Sanders Female    DOB: February 07, 1960   57 y.o.   MRN: [id number] Visit Date: 11/12/2017  Today's Provider: Rolm Gala, NP   Chief Complaint  Patient presents with  . Follow-up    refill medications for seizures   Subjective:    HPI Ann Sanders a 57 y/o female presents for follow up and medication refill. Patient denies seizure activities stated her last seizure was 25 years ago. She doesn't check Blood glucose regularly, takes metformin 500 mg twice daily. Denies vision changes,numbness, and  hypoglycemic episodes. States takes HCTZ for BP, denies chest pain, palpitation, light headedness, peripheral edema and  Dizziness. Does not monitor blood pressure. 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 She states she wakes up with mild headache and takes Imitrex as needed. Otherwise patient stated that she's in good health.    Allergies  Allergen Reactions  . Asa [Aspirin] Anaphylaxis  . Shellfish Allergy Itching   Previous Medications   APIXABAN (ELIQUIS) 5 MG TABS TABLET    Take 1 tablet (5 mg total) by mouth 2 (two) times daily.   ASCORBIC ACID (VITAMIN C) 1000 MG TABLET    Take 1,000 mg by mouth daily.    BIOTIN 40981 MCG TABS    Take 10,000 mcg by mouth daily.    CALCIUM CARBONATE 500 MG CHEW    Chew 500 mg by mouth daily.    CARBAMAZEPINE (TEGRETOL) 200 MG TABLET    TAKE TWO TABLETS BY MOUTH 2 TIMES A DAY   CHOLECALCIFEROL (VITAMIN D) 400 UNITS TABS TABLET    Take 1,000 Units by mouth.   CINNAMON PO    Take 1 tablet by mouth 2 (two) times daily.    CYANOCOBALAMIN (VITAMIN B 12 PO)    Take 1,000 mcg by mouth.   CYCLOBENZAPRINE (FLEXERIL) 10 MG TABLET    Take 1 tablet (10 mg total) by mouth at bedtime.   HYDROCHLOROTHIAZIDE (HYDRODIURIL) 25 MG TABLET    TAKE ONE TABLET BY MOUTH EVERY DAY   IBUPROFEN (ADVIL,MOTRIN) 800 MG TABLET    Take 1 tablet (800 mg total) by mouth every 8 (eight) hours  as needed.   LISINOPRIL (PRINIVIL,ZESTRIL) 10 MG TABLET    Take 1 tablet (10 mg total) by mouth daily.   METFORMIN (GLUCOPHAGE) 500 MG TABLET    Take 1 tablet (500 mg total) by mouth 2 (two) times daily with a meal.   SIMVASTATIN (ZOCOR) 40 MG TABLET    Take 1 tablet (40 mg total) by mouth daily.   SUMATRIPTAN (IMITREX) 100 MG TABLET    Take 100 mg by mouth every 2 (two) hours as needed for migraine. May repeat in 2 hours if headache persists or recurs.   VALPROIC ACID (DEPAKENE) 250 MG CAPSULE    TAKE 3 CAPSULES BY MOUTH EVERY MORNING AND 2 CAPSULES EVERY EVENING    Review of Systems  Constitutional: Negative.   HENT: Negative.   Eyes: Negative.   Respiratory: Negative.   Cardiovascular: Negative.   Gastrointestinal: Negative.   Endocrine: Negative.   Genitourinary: Negative.   Musculoskeletal: Negative.   Skin: Negative.   Neurological: Negative.   Psychiatric/Behavioral: Negative.     Social History   Tobacco Use  . Smoking status: Never Smoker  . Smokeless tobacco: Never Used  Substance Use Topics  . Alcohol use: No   Objective:   BP 117/73 (BP Location: Left Wrist, Patient Position: Sitting)  Pulse 72   Resp 14   Ht 5' (1.524 m)   Wt 201 lb 9.6 oz (91.4 kg)   SpO2 98%   BMI 39.37 kg/m   Physical Exam  Constitutional: She is oriented to person, place, and time. She appears well-developed and well-nourished.  HENT:  Head: Normocephalic and atraumatic.  Eyes: Pupils are equal, round, and reactive to light. EOM are normal.  Neck: Neck supple.  Cardiovascular: Normal rate and regular rhythm.  Pulmonary/Chest: Effort normal and breath sounds normal.  Abdominal: Soft. Bowel sounds are normal.  Musculoskeletal: Normal range of motion.  Neurological: She is alert and oriented to person, place, and time.  Skin: Skin is warm and dry.  Psychiatric: She has a normal mood and affect.        Assessment & Plan:     1. Diabetes mellitus without complication  (HCC) Continue on 500 mg Metformin twice daily, advised to monitor blood glucose regularly, HgbA1c and Lipid panel ordered . Eye exam referral.  2. Hypertension associated with diabetes (HCC) BP is controlled, 117/73, continues to take HCTZ and Lisinopril as directed.  3. Seizures (HCC) Stable, continues to take Depakote and Tegretol. Cbc with diff, cMET and Valproic acid level.  4. Dizziness No reports of dizziness today.  5. History of migraine headaches Continues to take Imitrex as needed.  6. Encounter for current long-term use of anticoagulants Continues to take Eliquis 5 mg twice daily for history of DVT, reports no claudication, swelling.  7. Health care maintenance Flu vaccine administered today Labs ordered cMet cbc with diff,lipid panel, hgbA1c Dental , Optometry and Mammogram to be scheduled Follow up in 6 months.      Rolm Gala, NP   [facility]

## 2017-11-13 ENCOUNTER — Ambulatory Visit: Payer: Self-pay | Admitting: Ophthalmology

## 2017-11-13 ENCOUNTER — Other Ambulatory Visit: Payer: Self-pay | Admitting: Gerontology

## 2017-11-13 DIAGNOSIS — E119 Type 2 diabetes mellitus without complications: Secondary | ICD-10-CM

## 2017-11-13 LAB — CBC WITH DIFFERENTIAL/PLATELET
BASOS: 1 %
Basophils Absolute: 0.1 10*3/uL (ref 0.0–0.2)
EOS (ABSOLUTE): 0.3 10*3/uL (ref 0.0–0.4)
Eos: 4 %
HEMOGLOBIN: 11.9 g/dL (ref 11.1–15.9)
Hematocrit: 35.9 % (ref 34.0–46.6)
Immature Grans (Abs): 0 10*3/uL (ref 0.0–0.1)
Immature Granulocytes: 0 %
LYMPHS ABS: 2.6 10*3/uL (ref 0.7–3.1)
Lymphs: 33 %
MCH: 30.1 pg (ref 26.6–33.0)
MCHC: 33.1 g/dL (ref 31.5–35.7)
MCV: 91 fL (ref 79–97)
MONOCYTES: 8 %
Monocytes Absolute: 0.7 10*3/uL (ref 0.1–0.9)
NEUTROS ABS: 4.4 10*3/uL (ref 1.4–7.0)
Neutrophils: 54 %
Platelets: 228 10*3/uL (ref 150–450)
RBC: 3.95 x10E6/uL (ref 3.77–5.28)
RDW: 12.7 % (ref 12.3–15.4)
WBC: 8 10*3/uL (ref 3.4–10.8)

## 2017-11-13 LAB — LIPID PANEL
CHOLESTEROL TOTAL: 187 mg/dL (ref 100–199)
Chol/HDL Ratio: 3.2 ratio (ref 0.0–4.4)
HDL: 59 mg/dL (ref >39)
LDL Calculated: 103 mg/dL — ABNORMAL HIGH (ref 0–99)
TRIGLYCERIDES: 126 mg/dL (ref 0–149)
VLDL Cholesterol Cal: 25 mg/dL (ref 5–40)

## 2017-11-13 LAB — COMPREHENSIVE METABOLIC PANEL
A/G RATIO: 2.2 (ref 1.2–2.2)
ALT: 8 IU/L (ref 0–32)
AST: 10 IU/L (ref 0–40)
Albumin: 4.1 g/dL (ref 3.5–5.5)
Alkaline Phosphatase: 42 IU/L (ref 39–117)
BUN/Creatinine Ratio: 26 — ABNORMAL HIGH (ref 9–23)
BUN: 19 mg/dL (ref 6–24)
CHLORIDE: 100 mmol/L (ref 96–106)
CO2: 22 mmol/L (ref 20–29)
Calcium: 10 mg/dL (ref 8.7–10.2)
Creatinine, Ser: 0.73 mg/dL (ref 0.57–1.00)
GFR calc Af Amer: 107 mL/min/{1.73_m2} (ref >59)
GFR calc non Af Amer: 93 mL/min/{1.73_m2} (ref >59)
GLUCOSE: 85 mg/dL (ref 65–99)
Globulin, Total: 1.9 g/dL (ref 1.5–4.5)
POTASSIUM: 4 mmol/L (ref 3.5–5.2)
Sodium: 140 mmol/L (ref 134–144)
Total Protein: 6 g/dL (ref 6.0–8.5)

## 2017-11-13 LAB — MICROALBUMIN / CREATININE URINE RATIO
Creatinine, Urine: 151.1 mg/dL
Microalb/Creat Ratio: 10.1 mg/g creat (ref 0.0–30.0)
Microalbumin, Urine: 15.2 ug/mL

## 2017-11-13 LAB — HEMOGLOBIN A1C
ESTIMATED AVERAGE GLUCOSE: 117 mg/dL
Hgb A1c MFr Bld: 5.7 % — ABNORMAL HIGH (ref 4.8–5.6)

## 2018-01-05 ENCOUNTER — Other Ambulatory Visit: Payer: Self-pay

## 2018-01-05 DIAGNOSIS — R569 Unspecified convulsions: Secondary | ICD-10-CM

## 2018-01-05 MED ORDER — CARBAMAZEPINE 200 MG PO TABS: ORAL_TABLET | ORAL | 0 refills | Status: DC

## 2018-01-15 ENCOUNTER — Ambulatory Visit: Payer: Self-pay | Admitting: Gerontology

## 2018-01-15 ENCOUNTER — Other Ambulatory Visit: Payer: Self-pay

## 2018-01-15 ENCOUNTER — Encounter: Payer: Self-pay | Admitting: Gerontology

## 2018-01-15 VITALS — BP 132/76 | HR 70 | Ht 61.5 in | Wt 196.7 lb

## 2018-01-15 DIAGNOSIS — G8911 Acute pain due to trauma: Secondary | ICD-10-CM

## 2018-01-15 DIAGNOSIS — M25511 Pain in right shoulder: Principal | ICD-10-CM

## 2018-01-15 DIAGNOSIS — R569 Unspecified convulsions: Secondary | ICD-10-CM

## 2018-01-15 MED ORDER — CARBAMAZEPINE 200 MG PO TABS: ORAL_TABLET | ORAL | 6 refills | Status: DC
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
## 2018-01-19 ENCOUNTER — Ambulatory Visit
Admission: RE | Admit: 2018-01-19 | Discharge: 2018-01-19 | Disposition: A | Discharge status: Home / Self Care | Payer: Self-pay | Attending: Gerontology | Admitting: Gerontology

## 2018-01-19 ENCOUNTER — Ambulatory Visit
Admission: RE | Admit: 2018-01-19 | Discharge: 2018-01-19 | Disposition: A | Discharge status: Home / Self Care | Payer: Self-pay | Source: Ambulatory Visit | Attending: Gerontology | Admitting: Gerontology

## 2018-01-19 DIAGNOSIS — M25511 Pain in right shoulder: Principal | ICD-10-CM

## 2018-01-19 DIAGNOSIS — G8911 Acute pain due to trauma: Secondary | ICD-10-CM

## 2018-01-22 ENCOUNTER — Other Ambulatory Visit: Payer: Self-pay

## 2018-01-29 ENCOUNTER — Other Ambulatory Visit: Payer: Self-pay

## 2018-01-29 DIAGNOSIS — E119 Type 2 diabetes mellitus without complications: Secondary | ICD-10-CM

## 2018-01-29 DIAGNOSIS — R569 Unspecified convulsions: Secondary | ICD-10-CM

## 2018-01-30 LAB — COMPREHENSIVE METABOLIC PANEL
A/G RATIO: 2 (ref 1.2–2.2)
ALT: 9 IU/L (ref 0–32)
AST: 11 IU/L (ref 0–40)
Albumin: 4.1 g/dL (ref 3.5–5.5)
Alkaline Phosphatase: 50 IU/L (ref 39–117)
BUN/Creatinine Ratio: 13 (ref 9–23)
BUN: 10 mg/dL (ref 6–24)
Bilirubin Total: 0.2 mg/dL (ref 0.0–1.2)
CALCIUM: 9.7 mg/dL (ref 8.7–10.2)
CO2: 22 mmol/L (ref 20–29)
Chloride: 101 mmol/L (ref 96–106)
Creatinine, Ser: 0.8 mg/dL (ref 0.57–1.00)
GFR calc Af Amer: 96 mL/min/{1.73_m2} (ref >59)
GFR calc non Af Amer: 83 mL/min/{1.73_m2} (ref >59)
Globulin, Total: 2.1 g/dL (ref 1.5–4.5)
Glucose: 93 mg/dL (ref 65–99)
Potassium: 4.9 mmol/L (ref 3.5–5.2)
Sodium: 139 mmol/L (ref 134–144)
Total Protein: 6.2 g/dL (ref 6.0–8.5)

## 2018-01-30 LAB — MICROALBUMIN / CREATININE URINE RATIO
CREATININE, UR: 183.1 mg/dL
Microalb/Creat Ratio: 7.6 mg/g creat (ref 0.0–30.0)
Microalbumin, Urine: 13.9 ug/mL

## 2018-01-30 LAB — VALPROIC ACID LEVEL: Valproic Acid Lvl: 59 ug/mL (ref 50–100)

## 2018-01-30 LAB — CARBAMAZEPINE LEVEL, TOTAL: Carbamazepine (Tegretol), S: 9.2 ug/mL (ref 4.0–12.0)

## 2018-02-09 ENCOUNTER — Other Ambulatory Visit: Payer: Self-pay | Admitting: Gerontology

## 2018-02-09 DIAGNOSIS — I10 Essential (primary) hypertension: Principal | ICD-10-CM

## 2018-02-09 DIAGNOSIS — E1159 Type 2 diabetes mellitus with other circulatory complications: Secondary | ICD-10-CM

## 2018-02-17 ENCOUNTER — Ambulatory Visit: Payer: Self-pay | Admitting: Specialist

## 2018-03-02 ENCOUNTER — Other Ambulatory Visit: Payer: Self-pay | Admitting: Gerontology

## 2018-03-02 DIAGNOSIS — I152 Hypertension secondary to endocrine disorders: Secondary | ICD-10-CM

## 2018-03-02 DIAGNOSIS — I10 Essential (primary) hypertension: Principal | ICD-10-CM

## 2018-03-02 DIAGNOSIS — E1159 Type 2 diabetes mellitus with other circulatory complications: Secondary | ICD-10-CM

## 2018-03-13 ENCOUNTER — Other Ambulatory Visit: Payer: Self-pay | Admitting: Gerontology

## 2018-03-13 DIAGNOSIS — E1169 Type 2 diabetes mellitus with other specified complication: Secondary | ICD-10-CM

## 2018-03-13 DIAGNOSIS — E785 Hyperlipidemia, unspecified: Secondary | ICD-10-CM

## 2018-03-13 DIAGNOSIS — R569 Unspecified convulsions: Secondary | ICD-10-CM

## 2018-03-16 ENCOUNTER — Other Ambulatory Visit: Payer: Self-pay | Admitting: Gerontology

## 2018-03-16 DIAGNOSIS — R569 Unspecified convulsions: Secondary | ICD-10-CM

## 2018-03-17 ENCOUNTER — Ambulatory Visit: Payer: Self-pay | Admitting: Specialist

## 2018-04-07 ENCOUNTER — Telehealth: Payer: Self-pay | Admitting: Pharmacist

## 2018-04-07 NOTE — Telephone Encounter (Signed)
04/07/2018 1:44:44 PM - [facility]/Eliquis refill to dr  04/07/2018 I have received a refill request form from [facility] for Eliquis 5mg  1 tablet by mouth two times a day-changed provider from [employer] (no longer at [facility]) to [facility], sending to [facility]Forde Radon

## 2018-04-13 ENCOUNTER — Other Ambulatory Visit: Payer: Self-pay | Admitting: Gerontology

## 2018-04-13 DIAGNOSIS — E1169 Type 2 diabetes mellitus with other specified complication: Secondary | ICD-10-CM

## 2018-04-13 DIAGNOSIS — E785 Hyperlipidemia, unspecified: Principal | ICD-10-CM

## 2018-04-14 ENCOUNTER — Other Ambulatory Visit: Payer: Self-pay | Admitting: Gerontology

## 2018-04-14 DIAGNOSIS — E785 Hyperlipidemia, unspecified: Principal | ICD-10-CM

## 2018-04-14 DIAGNOSIS — E1169 Type 2 diabetes mellitus with other specified complication: Secondary | ICD-10-CM

## 2018-04-14 MED ORDER — SIMVASTATIN 40 MG PO TABS: 40.0000 mg | ORAL_TABLET | Freq: Every day | ORAL | 0 refills | Status: DC

## 2018-04-17 ENCOUNTER — Telehealth: Payer: Self-pay | Admitting: Pharmacist

## 2018-04-17 NOTE — Telephone Encounter (Signed)
04/17/2018 10:58:17 AM - Eliquis  04/17/2018 Faxed [facility] refill request for Eliquis 5mg  Take 1 tablet by mouth two times a day.Ann Sanders
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
## 2018-05-12 ENCOUNTER — Other Ambulatory Visit: Payer: Self-pay

## 2018-05-12 DIAGNOSIS — R569 Unspecified convulsions: Secondary | ICD-10-CM

## 2018-05-12 MED ORDER — VALPROIC ACID 250 MG PO CAPS: ORAL_CAPSULE | ORAL | 0 refills | Status: DC

## 2018-05-13 ENCOUNTER — Other Ambulatory Visit: Payer: Self-pay | Admitting: Gerontology

## 2018-05-13 DIAGNOSIS — E785 Hyperlipidemia, unspecified: Secondary | ICD-10-CM

## 2018-05-13 DIAGNOSIS — E1169 Type 2 diabetes mellitus with other specified complication: Secondary | ICD-10-CM

## 2018-05-13 DIAGNOSIS — R569 Unspecified convulsions: Secondary | ICD-10-CM

## 2018-05-13 DIAGNOSIS — E119 Type 2 diabetes mellitus without complications: Secondary | ICD-10-CM

## 2018-05-13 NOTE — Progress Notes (Signed)
Established Patient Office Visit  Subjective:  Patient ID: Ann Sanders, female    DOB: May 07, 1960  Age: 58 y.o. MRN: [id number]  CC: No chief complaint on file.  Patient consents to telephone visit and 2 patient identifier was used to identify patient.  HPI Ann Sanders presents for follow up for right upper arm and shoulder pain due to fall in 12/2017, Pre diabetes, Hypertension, history of seizure and DVT.   She reports that her right arm and shoulder has resolved. She continues to take 500 mg metformin bid and she checks blood glucose as needed. She denies any hypoglycemic episodes. She also admits to taking 25 mg hctz and 10 mg lisinopril daily, and doesn't check blood pressure at home. She denies headache, dizziness, chest pain, and palpitation.   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 She states that it feel like "she's coming out of her skin". She reports that her last seizure activity was 30 years ago. She continues to take carbamazepine and  Valproic acid, and her Valproic acid level done on 01/29/2018 was 59 ug/ml and Tegretol level was 9.2 ug/ml.   She  Also has a history of DVT and continues to take Eluiquis 5 mg bid. She denies peripheral edema, claudication,hematuria, hematochezia, but experiences occasional bruises. She denies fever, chills and no further concern.  Past Medical History:  Diagnosis Date  . Diabetes mellitus without complication ([place])    pre diabetes  . DVT (deep vein thrombosis) in pregnancy   . Hypertension   . Seizures ([place])     History reviewed. No pertinent surgical history.  Family History  Problem Relation Age of Onset  . Heart disease Paternal Grandfather   . Hypertension Paternal Grandfather     Social History   Socioeconomic History  . Marital status: Single    Spouse name: Not on file  . Number of children: Not on file  . Years of education: Not on file  . Highest  education level: Not on file  Occupational History  . Not on file  Social Needs  . Financial resource strain: Very hard  . Food insecurity:    Worry: Often true    Inability: Often true  . Transportation needs:    Medical: Yes    Non-medical: Not on file  Tobacco Use  . Smoking status: Never Smoker  . Smokeless tobacco: Never Used  Substance and Sexual Activity  . Alcohol use: No  . Drug use: No  . Sexual activity: Not Currently  Lifestyle  . Physical activity:    Days per week: 0 days    Minutes per session: Not on file  . Stress: To some extent  Relationships  . Social connections:    Talks on phone: Not on file    Gets together: Not on file    Attends religious service: Not on file    Active member of club or organization: Not on file    Attends meetings of clubs or organizations: Not on file    Relationship status: Not on file  . Intimate partner violence:    Fear of current or ex partner: Patient refused    Emotionally abused: Patient refused    Physically abused: Patient refused    Forced sexual activity: Patient refused  Other Topics Concern  . Not on file  Social History Narrative  . Not on file    Outpatient Medications Prior to Visit  Medication  Sig Dispense Refill  . acetaminophen (TYLENOL) 500 MG tablet Take 1,000 mg by mouth every 6 (six) hours as needed.    Marland Kitchen apixaban (ELIQUIS) 5 MG TABS tablet Take 1 tablet (5 mg total) by mouth 2 (two) times daily. 60 tablet 3  . ascorbic acid (VITAMIN C) 1000 MG tablet Take 1 tablet (1,000 mg total) by mouth daily. 90 tablet 2  . Biotin 10000 MCG TABS Take 10,000 mcg by mouth daily. 90 tablet 2  . Calcium Carbonate 500 MG CHEW Chew 1 tablet (500 mg total) by mouth daily. 90 tablet 2  . carbamazepine (TEGRETOL) 200 MG tablet TAKE TWO TABLETS BY MOUTH 2 TIMES A DAY 120 tablet 6  . CINNAMON PO Take 1 tablet by mouth 2 (two) times daily.     . hydrochlorothiazide (HYDRODIURIL) 25 MG tablet TAKE ONE TABLET BY MOUTH EVERY  DAY 90 tablet 0  . lisinopril (PRINIVIL,ZESTRIL) 10 MG tablet TAKE ONE TABLET BY MOUTH EVERY DAY 90 tablet 0  . metFORMIN (GLUCOPHAGE) 500 MG tablet Take 1 tablet (500 mg total) by mouth 2 (two) times daily with a meal. 60 tablet 3  . simvastatin (ZOCOR) 40 MG tablet Take 1 tablet (40 mg total) by mouth daily. 30 tablet 0  . valproic acid (DEPAKENE) 250 MG capsule Take 3 capsules every morning and 2 capsules every night 150 capsule 0  . cholecalciferol (VITAMIN D) 400 units TABS tablet Take 2.5 tablets (1,000 Units total) by mouth daily. (Patient not taking: Reported on 01/15/2018) 90 each 2  . Cyanocobalamin (VITAMIN B 12 PO) Take 1,000 mcg by mouth.    . SUMAtriptan (IMITREX) 100 MG tablet Take 1 tablet (100 mg total) by mouth once for 1 dose. May repeat in 2 hours if headache persists or recurs. 10 tablet 0  . simvastatin (ZOCOR) 40 MG tablet TAKE ONE TABLET BY MOUTH EVERY DAY 30 tablet 0   No facility-administered medications prior to visit.     Allergies  Allergen Reactions  . Asa [Aspirin] Anaphylaxis  . Shellfish Allergy Itching    ROS Review of Systems  Constitutional: Negative.   HENT: Negative.   Respiratory: Negative.   Cardiovascular: Negative.   Gastrointestinal: Negative.   Endocrine: Negative.   Genitourinary: Negative.   Musculoskeletal: Negative.   Skin: Negative.   Neurological: Negative.   Hematological: Bruises/bleeds easily (bruises easily).  Psychiatric/Behavioral: Negative.       Objective:    Physical Exam   No vital sign and PE done  There were no vitals taken for this visit. Wt Readings from Last 3 Encounters:  01/15/18 196 lb 11.2 oz (89.2 kg)  11/12/17 201 lb 9.6 oz (91.4 kg)  05/20/17 213 lb (96.6 kg)     Health Maintenance Due  Topic Date Due  . Hepatitis C Screening  Jun 12, 1960  . PNEUMOCOCCAL POLYSACCHARIDE VACCINE AGE 62-64 HIGH RISK  11/07/1964  . FOOT EXAM  11/07/1972  . OPHTHALMOLOGY EXAM  11/07/1972  . HIV Screening   11/07/1977  . TETANUS/TDAP  11/07/1981  . PAP SMEAR-Modifier  11/08/1983  . MAMMOGRAM  11/07/2012  . COLONOSCOPY  11/07/2012  . HEMOGLOBIN A1C  05/14/2018    There are no preventive care reminders to display for this patient.  No results found for: TSH Lab Results  Component Value Date   WBC 8.0 11/12/2017   HGB 11.9 11/12/2017   HCT 35.9 11/12/2017   MCV 91 11/12/2017   PLT 228 11/12/2017   Lab Results  Component Value Date  NA 139 01/29/2018   K 4.9 01/29/2018   CO2 22 01/29/2018   GLUCOSE 93 01/29/2018   BUN 10 01/29/2018   CREATININE 0.80 01/29/2018   BILITOT 0.2 01/29/2018   ALKPHOS 50 01/29/2018   AST 11 01/29/2018   ALT 9 01/29/2018   PROT 6.2 01/29/2018   ALBUMIN 4.1 01/29/2018   CALCIUM 9.7 01/29/2018   ANIONGAP 11 03/29/2016   Lab Results  Component Value Date   CHOL 187 11/12/2017   Lab Results  Component Value Date   HDL 59 11/12/2017   Lab Results  Component Value Date   LDLCALC 103 (H) 11/12/2017   Lab Results  Component Value Date   TRIG 126 11/12/2017   Lab Results  Component Value Date   CHOLHDL 3.2 11/12/2017   Lab Results  Component Value Date   HGBA1C 5.7 (H) 11/12/2017      Assessment & Plan:     1. Diabetes mellitus without complication ([place]) - She will continue on current medication. HgbA1c done on 11/12/2017 was 5.7%, will recheck and plan to taper Metformin. - Comp Met (CMET); Future - HgB A1c; Future - Urinalysis; Future -Use Diabetic diet as advised  -Check blood sugar as needed -Write down the numbers against date in a log -Bring log to clinic every visit -Take medications regularly as advised -Regular exercise   2. Seizures ([place]) - She will continue on current treatment regimen. She was advised to go to the emergency room with any seizure activity. - Valproic Acid level; Future - Carbamazepine Level (Tegretol), total; Future  3. Hyperlipidemia associated with type 2 diabetes mellitus ([place]) - She will  continue current medication - Lipid panel; Future -Low fat Diet, like low fat dairy products eg skimmed milk -Avoid any fried food -Regular exercise/walk -Goal for Total Cholesterol is less than 200 -Goal for bad cholesterol LDL is less than 70 -Goal for Good cholesterol HDL is more than 45 -Goal for Triglyceride is less than 150   4. Hypertension, unspecified type - She will continue on current medication regimen. - Comp Met (CMET); Future - CBC w/Diff; Future - Urinalysis; Future -Low salt DASH diet -Take medications regularly on time -Exercise regularly as tolerated -Check blood pressure at home -Goal is less than 140/90 and normal blood pressure is 120/80   5. Long term (current) use of anticoagulants - She will continue on current treatment regimen, and was advised to go to the emergency room with bleeding.   Follow-up: Return in about 2 months (around 07/22/2018), or if symptoms worsen or fail to improve.    Amarria Andreasen Jerold Coombe, NP

## 2018-05-14 ENCOUNTER — Other Ambulatory Visit: Payer: Self-pay

## 2018-05-14 ENCOUNTER — Encounter: Payer: Self-pay | Admitting: Gerontology

## 2018-05-14 ENCOUNTER — Ambulatory Visit: Payer: Self-pay | Admitting: Gerontology

## 2018-05-14 ENCOUNTER — Other Ambulatory Visit: Payer: Self-pay | Admitting: Gerontology

## 2018-05-14 DIAGNOSIS — E785 Hyperlipidemia, unspecified: Principal | ICD-10-CM

## 2018-05-14 DIAGNOSIS — R569 Unspecified convulsions: Secondary | ICD-10-CM

## 2018-05-14 DIAGNOSIS — E1169 Type 2 diabetes mellitus with other specified complication: Secondary | ICD-10-CM

## 2018-05-14 DIAGNOSIS — I10 Essential (primary) hypertension: Secondary | ICD-10-CM

## 2018-05-14 DIAGNOSIS — Z7901 Long term (current) use of anticoagulants: Secondary | ICD-10-CM

## 2018-05-14 DIAGNOSIS — E119 Type 2 diabetes mellitus without complications: Secondary | ICD-10-CM

## 2018-06-08 ENCOUNTER — Telehealth: Payer: Self-pay | Admitting: Pharmacist

## 2018-06-08 NOTE — Telephone Encounter (Signed)
06/08/2018 2:18:42 PM - Eliquis renewal with Alver Fisher  06/08/2018 I have received a notice from Alver Fisher time for patient to renew for Eliquis. I have printed application-mailing patient her portion to sign & return with income/support & taxes/[id number] also a recertification packet (also previously mailed to pt). Sending provider portion to [facility] for provider to sign.Forde Radon

## 2018-06-16 ENCOUNTER — Other Ambulatory Visit: Payer: Self-pay | Admitting: Gerontology

## 2018-06-16 DIAGNOSIS — E1159 Type 2 diabetes mellitus with other circulatory complications: Secondary | ICD-10-CM

## 2018-06-19 ENCOUNTER — Telehealth: Payer: Self-pay | Admitting: Pharmacist

## 2018-06-19 NOTE — Telephone Encounter (Signed)
06/19/2018 10:15:39 AM - Eliquis renewal pending  06/19/2018 I have received the signed provider portion of [facility] application for Eliquis, holding for patient to return her portion mailed to her 06/08/2018.Forde Radon

## 2018-06-26 ENCOUNTER — Other Ambulatory Visit: Payer: Self-pay | Admitting: Gerontology

## 2018-06-26 DIAGNOSIS — Z7901 Long term (current) use of anticoagulants: Secondary | ICD-10-CM

## 2018-06-30 ENCOUNTER — Other Ambulatory Visit: Payer: Self-pay | Admitting: Gerontology

## 2018-06-30 ENCOUNTER — Other Ambulatory Visit: Payer: Self-pay

## 2018-06-30 DIAGNOSIS — Z7901 Long term (current) use of anticoagulants: Secondary | ICD-10-CM

## 2018-06-30 NOTE — Progress Notes (Signed)
Ann Sanders is on long term Eliquis 5 mg bid, needs a Cardiology evaluation for anti coagulation due to possible interaction with Carbamazepine and Valproic acid.

## 2018-07-06 ENCOUNTER — Other Ambulatory Visit: Payer: Self-pay | Admitting: Gerontology

## 2018-07-06 DIAGNOSIS — E1169 Type 2 diabetes mellitus with other specified complication: Secondary | ICD-10-CM

## 2018-07-06 DIAGNOSIS — E785 Hyperlipidemia, unspecified: Secondary | ICD-10-CM

## 2018-07-07 ENCOUNTER — Other Ambulatory Visit: Payer: Self-pay

## 2018-07-07 DIAGNOSIS — E119 Type 2 diabetes mellitus without complications: Secondary | ICD-10-CM

## 2018-07-07 DIAGNOSIS — E1159 Type 2 diabetes mellitus with other circulatory complications: Secondary | ICD-10-CM

## 2018-07-07 MED ORDER — LISINOPRIL 10 MG PO TABS: 10.0000 mg | ORAL_TABLET | Freq: Every day | ORAL | 0 refills | Status: DC

## 2018-07-07 MED ORDER — METFORMIN HCL 500 MG PO TABS: ORAL_TABLET | ORAL | 0 refills | Status: DC

## 2018-07-15 ENCOUNTER — Other Ambulatory Visit: Payer: Self-pay

## 2018-07-20 ENCOUNTER — Other Ambulatory Visit: Payer: Self-pay | Admitting: Gerontology

## 2018-07-20 DIAGNOSIS — E1169 Type 2 diabetes mellitus with other specified complication: Secondary | ICD-10-CM

## 2018-07-20 DIAGNOSIS — E785 Hyperlipidemia, unspecified: Secondary | ICD-10-CM

## 2018-07-22 ENCOUNTER — Ambulatory Visit: Payer: Self-pay | Admitting: Gerontology

## 2018-07-22 ENCOUNTER — Other Ambulatory Visit: Payer: Self-pay

## 2018-08-10 ENCOUNTER — Other Ambulatory Visit: Payer: Self-pay | Admitting: Gerontology

## 2018-08-10 DIAGNOSIS — E119 Type 2 diabetes mellitus without complications: Secondary | ICD-10-CM

## 2018-08-10 DIAGNOSIS — E1159 Type 2 diabetes mellitus with other circulatory complications: Secondary | ICD-10-CM

## 2018-08-10 DIAGNOSIS — I152 Hypertension secondary to endocrine disorders: Secondary | ICD-10-CM

## 2018-08-17 ENCOUNTER — Other Ambulatory Visit: Payer: Self-pay | Admitting: Gerontology

## 2018-08-17 DIAGNOSIS — R569 Unspecified convulsions: Secondary | ICD-10-CM

## 2018-08-19 ENCOUNTER — Telehealth: Payer: Self-pay

## 2018-08-19 NOTE — Telephone Encounter (Signed)
-----   Message from Marcie Mowers sent at 08/18/2018 12:49 PM EDT ----- Patient LVM stating she was suppose to have an appointment on 7/1 but never received a call.  She stated she had left her new number on a message (she thought) but gave it to [place] again.  It was a different number.

## 2018-08-19 NOTE — Telephone Encounter (Signed)
LVM for patient to call [facility] to reschedule last 2 missed appts.

## 2018-08-25 ENCOUNTER — Telehealth: Payer: Self-pay | Admitting: Pharmacy Technician

## 2018-08-25 NOTE — Telephone Encounter (Signed)
Received 2020 proof of income.  Patient eligible to receive medication assistance at [facility] as long as eligibility requirements continue to be met.  [facility]

## 2018-08-26 ENCOUNTER — Other Ambulatory Visit: Payer: Self-pay

## 2018-08-26 ENCOUNTER — Telehealth: Payer: Self-pay | Admitting: Pharmacist

## 2018-08-26 NOTE — Telephone Encounter (Signed)
08/26/2018 12:04:04 PM - Script to provider & BMS form to sign for dr  08/26/2018 Patient has signed her BMS application for renewal-Eliquis--provider had previously signed May, reprinted provider portion & script--taking to [facility] for Benjamine Mola to sign, then will fax to BMS for renewal.AJ

## 2018-08-27 ENCOUNTER — Other Ambulatory Visit: Payer: Self-pay

## 2018-08-27 ENCOUNTER — Ambulatory Visit: Payer: Self-pay | Admitting: Gerontology

## 2018-08-27 DIAGNOSIS — R569 Unspecified convulsions: Secondary | ICD-10-CM

## 2018-08-27 DIAGNOSIS — I10 Essential (primary) hypertension: Secondary | ICD-10-CM

## 2018-08-27 DIAGNOSIS — E119 Type 2 diabetes mellitus without complications: Secondary | ICD-10-CM

## 2018-08-27 DIAGNOSIS — E1169 Type 2 diabetes mellitus with other specified complication: Secondary | ICD-10-CM

## 2018-08-28 LAB — URINALYSIS
Bilirubin, UA: NEGATIVE
Glucose, UA: NEGATIVE
Ketones, UA: NEGATIVE
Leukocytes,UA: NEGATIVE
Nitrite, UA: NEGATIVE
Protein,UA: NEGATIVE
RBC, UA: NEGATIVE
Specific Gravity, UA: 1.02 (ref 1.005–1.030)
Urobilinogen, Ur: 0.2 mg/dL (ref 0.2–1.0)
pH, UA: 7 (ref 5.0–7.5)

## 2018-08-31 ENCOUNTER — Other Ambulatory Visit: Payer: Self-pay

## 2018-08-31 ENCOUNTER — Encounter: Payer: Self-pay | Admitting: Gerontology

## 2018-08-31 ENCOUNTER — Ambulatory Visit: Payer: Self-pay | Admitting: Gerontology

## 2018-08-31 DIAGNOSIS — Z7901 Long term (current) use of anticoagulants: Secondary | ICD-10-CM

## 2018-08-31 DIAGNOSIS — Z86718 Personal history of other venous thrombosis and embolism: Secondary | ICD-10-CM | POA: Insufficient documentation

## 2018-08-31 DIAGNOSIS — Z0000 Encounter for general adult medical examination without abnormal findings: Secondary | ICD-10-CM

## 2018-08-31 DIAGNOSIS — E119 Type 2 diabetes mellitus without complications: Secondary | ICD-10-CM

## 2018-08-31 DIAGNOSIS — Z87898 Personal history of other specified conditions: Secondary | ICD-10-CM

## 2018-08-31 DIAGNOSIS — I10 Essential (primary) hypertension: Secondary | ICD-10-CM

## 2018-08-31 DIAGNOSIS — R569 Unspecified convulsions: Secondary | ICD-10-CM

## 2018-08-31 DIAGNOSIS — R7303 Prediabetes: Secondary | ICD-10-CM

## 2018-08-31 DIAGNOSIS — E785 Hyperlipidemia, unspecified: Secondary | ICD-10-CM

## 2018-08-31 DIAGNOSIS — E1169 Type 2 diabetes mellitus with other specified complication: Secondary | ICD-10-CM

## 2018-08-31 DIAGNOSIS — E1159 Type 2 diabetes mellitus with other circulatory complications: Secondary | ICD-10-CM

## 2018-08-31 MED ORDER — LISINOPRIL 10 MG PO TABS: 10.0000 mg | ORAL_TABLET | Freq: Every day | ORAL | 3 refills | Status: DC

## 2018-08-31 MED ORDER — APIXABAN 5 MG PO TABS: ORAL_TABLET | ORAL | 0 refills | Status: DC

## 2018-08-31 MED ORDER — METFORMIN HCL 500 MG PO TABS: ORAL_TABLET | ORAL | 2 refills | Status: DC

## 2018-08-31 MED ORDER — HYDROCHLOROTHIAZIDE 25 MG PO TABS: 25.0000 mg | ORAL_TABLET | Freq: Every day | ORAL | 1 refills | Status: DC

## 2018-08-31 MED ORDER — SIMVASTATIN 40 MG PO TABS: 40.0000 mg | ORAL_TABLET | Freq: Every day | ORAL | 1 refills | Status: DC

## 2018-08-31 MED ORDER — VALPROIC ACID 250 MG PO CAPS: ORAL_CAPSULE | ORAL | 1 refills | Status: DC

## 2018-08-31 NOTE — Progress Notes (Signed)
Established Patient Office Visit  Subjective:  Patient ID: Ann Sanders, female    DOB: 10-Mar-1960  Age: 58 y.o. MRN: [id number]  CC: No chief complaint on file. Patient consents to telephone visit and 2 patient identifiers was used to identify patient.  HPI Ann Sanders presents for follow up of Pre diabetes, Hypertension, history of seizure, DVT and lab review.Her last HgbA1c done on 08/27/18 was 5.7%, she continues to take 500 mg metformin bid and she checks blood glucose as needed. She denies any hypoglycemic episodes. She also admits to taking 25 mg hctz and 10 mg lisinopril daily, and doesn't check blood pressure at home. She states that she adheres to [employer] diet and exercises as tolerated.She denies headache, dizziness, chest pain, and palpitation.   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 She states that it feel like "she's coming out of her skin". She reports that her last seizure activity was 30 years ago. She continues to take carbamazepine and  Valproic acid, and her Valproic acid level done on 08/27/2018 was 73 ug/ml and Tegretol level was 8.3 ug/ml.  She  also has a history of recurrent DVT and continues to take Eluiquis 5 mg bid. She denies peripheral edema, claudication,hematuria, hematochezia, and bruises. She states that she has not had mammogram , pap smear nor colonoscopy done.  She denies fever, chills and no further concern.  Past Medical History:  Diagnosis Date  . Diabetes mellitus without complication (H[facility])    pre diabetes  . DVT (deep vein thrombosis) in pregnancy   . Hypertension   . Seizures ([facility])     No past surgical history on file.  Family History  Problem Relation Age of Onset  . Heart disease Paternal Grandfather   . Hypertension Paternal Grandfather     Social History   Socioeconomic History  . Marital status: Single    Spouse name: Not on file  . Number of children: Not on file   . Years of education: Not on file  . Highest education level: Not on file  Occupational History  . Not on file  Social Needs  . Financial resource strain: Very hard  . Food insecurity    Worry: Often true    Inability: Often true  . Transportation needs    Medical: Yes    Non-medical: Not on file  Tobacco Use  . Smoking status: Never Smoker  . Smokeless tobacco: Never Used  Substance and Sexual Activity  . Alcohol use: No  . Drug use: No  . Sexual activity: Not Currently  Lifestyle  . Physical activity    Days per week: 0 days    Minutes per session: Not on file  . Stress: To some extent  Relationships  . Social Musicianconnections    Talks on phone: Not on file    Gets together: Not on file    Attends religious service: Not on file    Active member of club or organization: Not on file    Attends meetings of clubs or organizations: Not on file    Relationship status: Not on file  . Intimate partner violence    Fear of current or ex partner: Patient refused    Emotionally abused: Patient refused    Physically abused: Patient refused    Forced sexual activity: Patient refused  Other Topics Concern  . Not on file  Social History Narrative  . Not on file  Outpatient Medications Prior to Visit  Medication Sig Dispense Refill  . acetaminophen (TYLENOL) 500 MG tablet Take 1,000 mg by mouth every 6 (six) hours as needed.    Marland Kitchen ascorbic acid (VITAMIN C) 1000 MG tablet Take 1 tablet (1,000 mg total) by mouth daily. 90 tablet 2  . Biotin 10000 MCG TABS Take 10,000 mcg by mouth daily. 90 tablet 2  . Calcium Carbonate 500 MG CHEW Chew 1 tablet (500 mg total) by mouth daily. 90 tablet 2  . carbamazepine (TEGRETOL) 200 MG tablet TAKE TWO TABLETS BY MOUTH 2 TIMES A DAY 120 tablet 6  . cholecalciferol (VITAMIN D) 400 units TABS tablet Take 2.5 tablets (1,000 Units total) by mouth daily. (Patient not taking: Reported on 01/15/2018) 90 each 2  . CINNAMON PO Take 1 tablet by mouth 2 (two)  times daily.     . Cyanocobalamin (VITAMIN B 12 PO) Take 1,000 mcg by mouth.    . SUMAtriptan (IMITREX) 100 MG tablet Take 1 tablet (100 mg total) by mouth once for 1 dose. May repeat in 2 hours if headache persists or recurs. 10 tablet 0  . ELIQUIS 5 MG TABS tablet TAKE ONE TABLET BY MOUTH 2 TIMES A DAY 60 tablet 0  . hydrochlorothiazide (HYDRODIURIL) 25 MG tablet TAKE ONE TABLET BY MOUTH EVERY DAY 90 tablet 0  . lisinopril (ZESTRIL) 10 MG tablet TAKE ONE TABLET BY MOUTH EVERY DAY 30 tablet 0  . metFORMIN (GLUCOPHAGE) 500 MG tablet TAKE ONE TABLET BY MOUTH 2 TIMES A DAY WITH A MEAL 60 tablet 0  . simvastatin (ZOCOR) 40 MG tablet TAKE ONE TABLET BY MOUTH EVERY DAY 60 tablet 0  . valproic acid (DEPAKENE) 250 MG capsule TAKE 3 CAPSULES BY MOUTH EVERY MORNING AND 2 CAPSULES EVERY NIGHT 150 capsule 0   No facility-administered medications prior to visit.     Allergies  Allergen Reactions  . Asa [Aspirin] Anaphylaxis  . Shellfish Allergy Itching    ROS Review of Systems  Constitutional: Negative.   Respiratory: Negative.   Cardiovascular: Negative.   Gastrointestinal: Negative.   Genitourinary: Negative.   Musculoskeletal: Negative.   Skin: Negative.   Neurological: Negative.   Hematological: Negative.   Psychiatric/Behavioral: Negative.       Objective:    Physical Exam No vital sign or PE done Wt 204 lb (92.5 kg)   BMI 37.92 kg/m  Wt Readings from Last 3 Encounters:  08/31/18 204 lb (92.5 kg)  01/15/18 196 lb 11.2 oz (89.2 kg)  11/12/17 201 lb 9.6 oz (91.4 kg)   She was encouraged to continue on weight loss regimen.  Health Maintenance Due  Topic Date Due  . Hepatitis C Screening  Apr 21, 1960  . PNEUMOCOCCAL POLYSACCHARIDE VACCINE AGE 60-64 HIGH RISK  11/07/1964  . FOOT EXAM  11/07/1972  . OPHTHALMOLOGY EXAM  11/07/1972  . HIV Screening  11/07/1977  . PAP SMEAR-Modifier  11/08/1983  . MAMMOGRAM  11/07/2012  . COLONOSCOPY  11/07/2012  . INFLUENZA VACCINE   08/22/2018    There are no preventive care reminders to display for this patient.  No results found for: TSH Lab Results  Component Value Date   WBC 6.6 08/27/2018   HGB 12.1 08/27/2018   HCT 35.2 08/27/2018   MCV 87 08/27/2018   PLT 237 08/27/2018   Lab Results  Component Value Date   NA WILL FOLLOW 08/27/2018   K WILL FOLLOW 08/27/2018   CO2 WILL FOLLOW 08/27/2018   GLUCOSE WILL FOLLOW 08/27/2018  BUN WILL FOLLOW 08/27/2018   CREATININE WILL FOLLOW 08/27/2018   BILITOT WILL FOLLOW 08/27/2018   ALKPHOS WILL FOLLOW 08/27/2018   AST WILL FOLLOW 08/27/2018   ALT WILL FOLLOW 08/27/2018   PROT WILL FOLLOW 08/27/2018   ALBUMIN WILL FOLLOW 08/27/2018   CALCIUM WILL FOLLOW 08/27/2018   ANIONGAP 11 03/29/2016   Lab Results  Component Value Date   CHOL WILL FOLLOW 08/27/2018   Lab Results  Component Value Date   HDL WILL FOLLOW 08/27/2018   Lab Results  Component Value Date   LDLCALC WILL FOLLOW 08/27/2018   Lab Results  Component Value Date   TRIG WILL FOLLOW 08/27/2018   Lab Results  Component Value Date   CHOLHDL WILL FOLLOW 08/27/2018   Lab Results  Component Value Date   HGBA1C 5.7 (H) 08/27/2018      Assessment & Plan:   .1. Encounter for current long-term use of anticoagulants -Per Pharmacy, Eliquis might have possible interaction with Carbamazepine and she states that she's being on both medication for many years. She defers going to any specialist this time. She was advised to notify clinic for hematuria, hematochezia, active bleeding or claudication. - apixaban (ELIQUIS) 5 MG TABS tablet; TAKE ONE TABLET BY MOUTH 2 TIMES A DAY  Dispense: 60 tablet; Refill: 0  2. Hyperlipidemia associated with type 2 diabetes mellitus (HCC) - She will continue on current treatment regimen and will notify her when her Lipid panel result is out. --Low fat Diet, like low fat dairy products eg skimmed milk -Avoid any fried food -Regular exercise/walk -Goal for Total  Cholesterol is less than 200 -Goal for bad cholesterol LDL is less than 100 -Goal for Good cholesterol HDL is more than 45 -Goal for Triglyceride is less than 150 - simvastatin (ZOCOR) 40 MG tablet; Take 1 tablet (40 mg total) by mouth daily.  Dispense: 60 tablet; Refill: 1  3. Health care maintenance - She was encouraged to complete charity care application for Colonoscopy and to schedule appointment with [facility] for Mammogram and pap smear. - MM Digital Screening; Future - Cytology - PAP( [place]) - Ambulatory referral to Gastroenterology for colonoscopy  4. History of prediabetes - She will continue on current treatment regimen -Use Diabetic diet as advised  -Take medications regularly as advised -Regular exercise - metFORMIN (GLUCOPHAGE) 500 MG tablet; Take 1 tablet by mouth with food two times a day  Dispense: 60 tablet; Refill: 2  5. Essential hypertension - She was encouraged to stop by the clinic and pick up blood pressure machine. She will continue on current treatment regimen. --Low salt DASH diet -Take medications regularly on time -Exercise regularly as tolerated -Check blood pressure daily record and bring log to clinic -Goal is less than 140/90 and normal blood pressure is 120/80 - hydrochlorothiazide (HYDRODIURIL) 25 MG tablet; Take 1 tablet (25 mg total) by mouth daily.  Dispense: 90 tablet; Refill: 1 - lisinopril (ZESTRIL) 10 MG tablet; Take 1 tablet (10 mg total) by mouth daily.  Dispense: 30 tablet; Refill: 3   6. History of seizure - Her Valproic acid and Tegretol level was normal and she will continue on current treatment regimen and will recheck in 6 months. -- valproic acid (DEPAKENE) 250 MG capsule; Take 3 capsules by mouth in the morning and 2 capsules at night  Dispense: 150 capsule; Refill: 1      Follow-up: Return in about 8 weeks (around 10/27/2018), or if symptoms worsen or fail to improve.    Rickardo Brinegar  Jerold Coombe, NP

## 2018-08-31 NOTE — Patient Instructions (Signed)

## 2018-09-01 ENCOUNTER — Telehealth: Payer: Self-pay

## 2018-09-01 ENCOUNTER — Other Ambulatory Visit: Payer: Self-pay

## 2018-09-01 DIAGNOSIS — Z1211 Encounter for screening for malignant neoplasm of colon: Secondary | ICD-10-CM

## 2018-09-01 DIAGNOSIS — Z0000 Encounter for general adult medical examination without abnormal findings: Secondary | ICD-10-CM

## 2018-09-01 NOTE — Telephone Encounter (Signed)
Gastroenterology Pre-Procedure Review  Request Date: 09/17/18 Requesting Physician: Dr. Allen Norris  PATIENT REVIEW QUESTIONS: The patient responded to the following health history questions as indicated:    1. Are you having any GI issues? no 2. Do you have a personal history of Polyps? no 3. Do you have a family history of Colon Cancer or Polyps? no 4. Diabetes Mellitus? yes (takes metformin) 5. Joint replacements in the past 12 months?no 6. Major health problems in the past 3 months?no 7. Any artificial heart valves, MVP, or defibrillator?no    MEDICATIONS & ALLERGIES:    Patient reports the following regarding taking any anticoagulation/antiplatelet therapy:   Plavix, Coumadin, Eliquis, Xarelto, Lovenox, Pradaxa, Brilinta, or Effient? yes (Effient Blood Thinner Request Sent to [facility]) Aspirin? no  Patient confirms/reports the following medications:  Current Outpatient Medications  Medication Sig Dispense Refill  . acetaminophen (TYLENOL) 500 MG tablet Take 1,000 mg by mouth every 6 (six) hours as needed.    Marland Kitchen apixaban (ELIQUIS) 5 MG TABS tablet TAKE ONE TABLET BY MOUTH 2 TIMES A DAY 60 tablet 0  . ascorbic acid (VITAMIN C) 1000 MG tablet Take 1 tablet (1,000 mg total) by mouth daily. 90 tablet 2  . Biotin 10000 MCG TABS Take 10,000 mcg by mouth daily. 90 tablet 2  . Calcium Carbonate 500 MG CHEW Chew 1 tablet (500 mg total) by mouth daily. 90 tablet 2  . carbamazepine (TEGRETOL) 200 MG tablet TAKE TWO TABLETS BY MOUTH 2 TIMES A DAY 120 tablet 6  . cholecalciferol (VITAMIN D) 400 units TABS tablet Take 2.5 tablets (1,000 Units total) by mouth daily. (Patient not taking: Reported on 01/15/2018) 90 each 2  . CINNAMON PO Take 1 tablet by mouth 2 (two) times daily.     . Cyanocobalamin (VITAMIN B 12 PO) Take 1,000 mcg by mouth.    . hydrochlorothiazide (HYDRODIURIL) 25 MG tablet Take 1 tablet (25 mg total) by mouth daily. 90 tablet 1  . lisinopril (ZESTRIL) 10 MG tablet Take 1  tablet (10 mg total) by mouth daily. 30 tablet 3  . metFORMIN (GLUCOPHAGE) 500 MG tablet Take 1 tablet by mouth with food two times a day 60 tablet 2  . simvastatin (ZOCOR) 40 MG tablet Take 1 tablet (40 mg total) by mouth daily. 60 tablet 1  . SUMAtriptan (IMITREX) 100 MG tablet Take 1 tablet (100 mg total) by mouth once for 1 dose. May repeat in 2 hours if headache persists or recurs. 10 tablet 0  . valproic acid (DEPAKENE) 250 MG capsule Take 3 capsules by mouth in the morning and 2 capsules at night 150 capsule 1   No current facility-administered medications for this visit.     Patient confirms/reports the following allergies:  Allergies  Allergen Reactions  . Asa [Aspirin] Anaphylaxis  . Shellfish Allergy Itching    No orders of the defined types were placed in this encounter.   AUTHORIZATION INFORMATION Primary Insurance: 1D#: Group #:  Secondary Insurance: 1D#: Group #:  SCHEDULE INFORMATION: Date: 09/17/18 Time: Location:[facility]

## 2018-09-02 ENCOUNTER — Telehealth: Payer: Self-pay

## 2018-09-02 LAB — COMPREHENSIVE METABOLIC PANEL
ALT: 9 IU/L (ref 0–32)
AST: 13 IU/L (ref 0–40)
Albumin/Globulin Ratio: 2.2 (ref 1.2–2.2)
Albumin: 4.1 g/dL (ref 3.8–4.9)
Alkaline Phosphatase: 54 IU/L (ref 39–117)
BUN/Creatinine Ratio: 13 (ref 9–23)
BUN: 9 mg/dL (ref 6–24)
Bilirubin Total: <0.2 mg/dL (ref 0.0–1.2)
CO2: 21 mmol/L (ref 20–29)
Calcium: 9.6 mg/dL (ref 8.7–10.2)
Chloride: 99 mmol/L (ref 96–106)
Creatinine, Ser: 0.71 mg/dL (ref 0.57–1.00)
GFR calc Af Amer: 111 mL/min/{1.73_m2} (ref >59)
GFR calc non Af Amer: 96 mL/min/{1.73_m2} (ref >59)
Globulin, Total: 1.9 g/dL (ref 1.5–4.5)
Glucose: 106 mg/dL — ABNORMAL HIGH (ref 65–99)
Potassium: 4.2 mmol/L (ref 3.5–5.2)
Sodium: 141 mmol/L (ref 134–144)
Total Protein: 6 g/dL (ref 6.0–8.5)

## 2018-09-02 LAB — CBC WITH DIFFERENTIAL/PLATELET
Basophils Absolute: 0.1 10*3/uL (ref 0.0–0.2)
Basos: 1 %
EOS (ABSOLUTE): 0.3 10*3/uL (ref 0.0–0.4)
Eos: 5 %
Hematocrit: 35.2 % (ref 34.0–46.6)
Hemoglobin: 12.1 g/dL (ref 11.1–15.9)
Immature Grans (Abs): 0 10*3/uL (ref 0.0–0.1)
Immature Granulocytes: 1 %
Lymphocytes Absolute: 2 10*3/uL (ref 0.7–3.1)
Lymphs: 30 %
MCH: 30 pg (ref 26.6–33.0)
MCHC: 34.4 g/dL (ref 31.5–35.7)
MCV: 87 fL (ref 79–97)
Monocytes Absolute: 0.6 10*3/uL (ref 0.1–0.9)
Monocytes: 9 %
Neutrophils Absolute: 3.6 10*3/uL (ref 1.4–7.0)
Neutrophils: 54 %
Platelets: 237 10*3/uL (ref 150–450)
RBC: 4.03 x10E6/uL (ref 3.77–5.28)
RDW: 13.1 % (ref 11.7–15.4)
WBC: 6.6 10*3/uL (ref 3.4–10.8)

## 2018-09-02 LAB — LIPID PANEL
Chol/HDL Ratio: 2.5 ratio (ref 0.0–4.4)
Cholesterol, Total: 159 mg/dL (ref 100–199)
HDL: 64 mg/dL (ref >39)
LDL Calculated: 72 mg/dL (ref 0–99)
Triglycerides: 115 mg/dL (ref 0–149)
VLDL Cholesterol Cal: 23 mg/dL (ref 5–40)

## 2018-09-02 LAB — HEMOGLOBIN A1C
Est. average glucose Bld gHb Est-mCnc: 117 mg/dL
Hgb A1c MFr Bld: 5.7 % — ABNORMAL HIGH (ref 4.8–5.6)

## 2018-09-02 LAB — CARBAMAZEPINE LEVEL, TOTAL: Carbamazepine (Tegretol), S: 8.3 ug/mL (ref 4.0–12.0)

## 2018-09-02 LAB — VALPROIC ACID LEVEL: Valproic Acid Lvl: 73 ug/mL (ref 50–100)

## 2018-09-02 NOTE — Telephone Encounter (Signed)
LVM for patient to call [facility] to schedule 8 week followup appt around 10/27/2018. Mailing charity care application today. Sending number for [facility] program to schedule mammogram. Can't schedule eye exam at this time due to no provider in clinic at [facility].

## 2018-09-03 ENCOUNTER — Telehealth: Payer: Self-pay | Admitting: Pharmacist

## 2018-09-03 NOTE — Telephone Encounter (Signed)
09/03/2018 10:32:29 AM - Eliquis BMS renewal  09/03/2018 Faxed [facility] renewal application for Eliquis 5mg  Take one tablet by mouth 2 times a day.Delos Haring

## 2018-09-10 ENCOUNTER — Institutional Professional Consult (permissible substitution): Payer: Self-pay | Admitting: Licensed Clinical Social Worker

## 2018-09-10 ENCOUNTER — Other Ambulatory Visit: Payer: Self-pay

## 2018-09-10 ENCOUNTER — Encounter: Payer: Self-pay | Admitting: *Deleted

## 2018-09-11 ENCOUNTER — Encounter: Payer: Self-pay | Admitting: Anesthesiology

## 2018-09-11 NOTE — Discharge Instructions (Signed)

## 2018-09-11 NOTE — Anesthesia Preprocedure Evaluation (Deleted)
Anesthesia Evaluation    Airway        Dental   Pulmonary           Cardiovascular hypertension,   DVT 2005 on Eliquis  Echo 06/09/13:   NORMAL LEFT VENTRICULAR SYSTOLIC FUNCTION  NORMAL RIGHT VENTRICULAR SYSTOLIC FUNCTION  VALVULAR REGURGITATION: TRIVIAL AR, TRIVIAL MR, MILD TR  NO VALVULAR STENOSIS  BRADYCARDIA THROUGHOUT EXAM (50's)   Neuro/Psych  Headaches, Seizures - (on valproic acid and carbamazepine), Well Controlled,  Vertigo     GI/Hepatic   Endo/Other  diabetes, Type 2  Renal/GU      Musculoskeletal   Abdominal   Peds  Hematology   Anesthesia Other Findings   Reproductive/Obstetrics                             Anesthesia Physical Anesthesia Plan  ASA: II  Anesthesia Plan: General   Post-op Pain Management:    Induction: Intravenous  PONV Risk Score and Plan: 3 and Propofol infusion and TIVA  Airway Management Planned: Natural Airway  Additional Equipment:   Intra-op Plan:   Post-operative Plan:   Informed Consent:   Plan Discussed with:   Anesthesia Plan Comments:         Anesthesia Quick Evaluation

## 2018-09-14 ENCOUNTER — Other Ambulatory Visit: Admission: RE | Admit: 2018-09-14 | Payer: Self-pay | Source: Ambulatory Visit

## 2018-09-17 ENCOUNTER — Ambulatory Visit: Admission: RE | Admit: 2018-09-17 | Payer: Self-pay | Source: Home / Self Care | Admitting: Gastroenterology

## 2018-09-17 HISTORY — DX: Dizziness and giddiness: R42

## 2018-09-17 HISTORY — DX: Migraine, unspecified, not intractable, without status migrainosus: G43.909

## 2018-09-17 SURGERY — COLONOSCOPY WITH PROPOFOL
Anesthesia: Choice

## 2018-09-21 ENCOUNTER — Other Ambulatory Visit: Payer: Self-pay | Admitting: Gerontology

## 2018-09-21 DIAGNOSIS — R569 Unspecified convulsions: Secondary | ICD-10-CM

## 2018-09-22 ENCOUNTER — Ambulatory Visit: Payer: Self-pay | Admitting: Licensed Clinical Social Worker

## 2018-09-22 ENCOUNTER — Other Ambulatory Visit: Payer: Self-pay

## 2018-09-22 ENCOUNTER — Encounter: Payer: Self-pay | Admitting: Licensed Clinical Social Worker

## 2018-09-22 DIAGNOSIS — F331 Major depressive disorder, recurrent, moderate: Secondary | ICD-10-CM

## 2018-09-22 DIAGNOSIS — F419 Anxiety disorder, unspecified: Secondary | ICD-10-CM

## 2018-09-22 NOTE — BH Specialist Note (Signed)
Integrated Behavioral Health Comprehensive Clinical Assessment Via Phone.   MRN: [id number] Name: Abygale Karpf [facility]  Type of Service: Integrated Behavioral Health-Individual Interpretor: No. Interpretor Name and Language: Not applicable.   PRESENTING CONCERNS: Ann Sanders is a 58 y.o. female accompanied by herself.Ann Sanders was self referred to [facility] clinician for mental health.  Previous mental health services Have you ever been treated for a mental health problem? Yes If "Yes", when were you treated and whom did you see?  Have you ever been hospitalized for mental health treatment? Negative Have you ever been treated for any of the following? Past Psychiatric History/Hospitalization(s): Anxiety: Yes Ann Sanders describes having anxiety in the last few months due to fear of loosing her job and concern for finances. She explains that she ended up taking a month off from her job due to depression and resumed working today. Her symptoms include: feeling anxious, nervous, or on edge a few days through out the week, not being able to stop or control worrying, worrying too much about different things, and becoming easily annoyed or irritable.  Bipolar Disorder: Negative Depression: Yes Ann Sanders has been experiencing depression since her non biological brother passed away in 03/20/03 and her step grandmother/surrogate mom passed away on 09/29/1998. She explains that she goes through cycles of depression that usually occur around July and August due to the anniversaries of death in her family. She describes feeling down and depressed nearly every day, loss of interest in previously enjoyed activities, problems falling asleep, loss of appetite, feeling bad about herself, isolating herself away from others, and difficulty concentrating. She denies suicidal and homicidal thoughts. There is access to a fire arm that is unloaded and remains  locked up where the patient does not have access to it. Per the patient, ammunition is kept separate from fire arm and boyfriend is the only one with a key.  Mania: Negative Psychosis: Negative Schizophrenia: Negative Personality Disorder: Negative Hospitalization for psychiatric illness: Negative History of Electroconvulsive Shock Therapy: Negative Prior Suicide Attempts: Negative Have you ever had thoughts of harming yourself or others or attempted suicide? No plan to harm self or others  Medical history  has a past medical history of Diabetes mellitus without complication ([place]), DVT (deep vein thrombosis) in pregnancy 03-20-2003), Hypertension, Migraine headache, Seizures ([place]), and Vertigo. Primary Care Physician: Patient, [name] Date of last physical exam:  Allergies:  Allergies  Allergen Reactions  . Asa [Aspirin] Anaphylaxis  . Shellfish Allergy Itching   Current medications:  Outpatient Encounter Medications as of 09/22/2018  Medication Sig  . acetaminophen (TYLENOL) 500 MG tablet Take 1,000 mg by mouth every 6 (six) hours as needed.  Marland Kitchen apixaban (ELIQUIS) 5 MG TABS tablet TAKE ONE TABLET BY MOUTH 2 TIMES A DAY  . ascorbic acid (VITAMIN C) 1000 MG tablet Take 1 tablet (1,000 mg total) by mouth daily.  . Biotin 10000 MCG TABS Take 10,000 mcg by mouth daily. (Patient not taking: Reported on 09/10/2018)  . Calcium Carbonate 500 MG CHEW Chew 1 tablet (500 mg total) by mouth daily.  . carbamazepine (TEGRETOL) 200 MG tablet TAKE TWO TABLETS BY MOUTH 2 TIMES A DAY  . cholecalciferol (VITAMIN D) 400 units TABS tablet Take 2.5 tablets (1,000 Units total) by mouth daily. (Patient not taking: Reported on 01/15/2018)  . CINNAMON PO Take 1 tablet by mouth 2 (two) times daily.   . Cyanocobalamin (VITAMIN B 12 PO) Take 1,000 mcg by  mouth.  . hydrochlorothiazide (HYDRODIURIL) 25 MG tablet Take 1 tablet (25 mg total) by mouth daily.  Marland Kitchen lisinopril (ZESTRIL) 10 MG tablet Take 1 tablet (10 mg total)  by mouth daily.  . metFORMIN (GLUCOPHAGE) 500 MG tablet Take 1 tablet by mouth with food two times a day  . simvastatin (ZOCOR) 40 MG tablet Take 1 tablet (40 mg total) by mouth daily.  . SUMAtriptan (IMITREX) 100 MG tablet Take 1 tablet (100 mg total) by mouth once for 1 dose. May repeat in 2 hours if headache persists or recurs.  . valproic acid (DEPAKENE) 250 MG capsule Take 3 capsules by mouth in the morning and 2 capsules at night   No facility-administered encounter medications on file as of 09/22/2018.    Have you ever had any serious medication reactions? Yes- asprin.  Is there any history of mental health problems or substance abuse in your family? Yes- [name] reports that her biological mother carried a diagnosis of bipolar disorder. She explains that she passed away in the last couple of years and doesn't know much about her biological mother. She explains that she was raised by her paternal grandfather and his wife. She denies any other history of mental illness or substance abuse in the family.  Has anyone in your family been hospitalized for mental health treatment? Yes- Ann Sanders reports that she was not privy to this information.   Social/family history Who lives in your current household? Ann Sanders lives with her boyfriend.  What is your family of origin, childhood history? Ann Sanders was born in [place].  Where were you born? See above. Where did you grow up? Ann Sanders was raised in [place] by her paternal grandfather and his wife.  How many different homes have you lived in? A few.  Describe your childhood: Ann Sanders explains that her dad passed away in a car accident when she was only [age] years old so her paternal grandfather and his wife adopted her. She explains that she never met her mother and was told she carried a diagnosis of Bipolar disorder. She explains that despite never meeting her mom and her dad passing away when she  was only [age] years old that she had a fun child hood. She explained that she grew up with her grandfather's wife's son who was eleven years older and that they were very close.  Do you have siblings, step/half siblings? Yes- Ms. Bignell reports that she has four half siblings from her mom. She reports that one of her half siblings passed away in the last year. She explains that she has recently reached out to her half siblings on face book but has yet to meet them in person. She has one half brother and three half sisters.  What are their names, relation, sex, age? See above. Are your parents separated or divorced? No What are your social supports? Ms. Dahlia Bailiff has the support of her best friend and a guy friend.   Education How many grades have you completed? Some college but did not graduate.  Did you have any problems in school? No  Employment/financial issues Ms. Arbogast works part time at [employer] and [employer]. She has been working there for almost two years in November of 2020.   Sleep Usual bedtime is  Varies. Sleeping arrangements: see above. Problems with snoring: No Obstructive sleep apnea is not a concern. Problems with nightmares: No Problems with night terrors: No Problems with sleepwalking:  No  Trauma/Abuse history Have you ever experienced or been exposed to any form of abuse? No Have you ever experienced or been exposed to something traumatic? No  Substance use Do you use alcohol, nicotine or caffeine? no alcohol use How old were you when you first tasted alcohol? Ms. Duffy denies abusing alcohol or other drugs. She has not previously been in substance abuse treatment in the past.  Have you ever used illicit drugs or abused prescription medications? Ms. Hark denies abusing illicit or prescription drugs.   Mental status General appearance/Behavior: Unable to assess due to assessment completed by phone because of COVID 19.  Eye contact:  Absent Motor behavior: unable to assess due to assessment completed via phone because of COVID 19. Speech: Normal Level of consciousness: Alert Mood: Euthymic Affect: Appropriate Anxiety level: Minimal Thought process: Coherent Thought content: WNL Perception: Normal Judgment: Good Insight: Present  Diagnosis No diagnosis found.  GOALS ADDRESSED: Patient will reduce symptoms of: anxiety and depression and increase knowledge and/or ability of: coping skills, healthy habits, self-management skills and stress reduction and also: Increase healthy adjustment to current life circumstances              INTERVENTIONS: Interventions utilized: Psychoeducation and/or Health Education Standardized Assessments completed: GAD-7 and PHQ 9   ASSESSMENT/OUTCOME:  Nhu Glasby is a 58 year old Caucasian female who presents today for a mental health assessment was self referred. Ms. Lormand reports that she has been experiencing symptoms of depression since her non biological brother passed away in 08-14-2003 and her step grandmother/surrogate mother passed away on 09-26-1998. She struggles with depression around the anniversaries of her loved's passing. She previously spoke to a therapist a few times 10 to 11 years ago but felt it was ineffective due to only discussing things on surface level. She reports that she was previously prescribed Zoloft by Dr. Linus Mako for a year in a half but is unable to remember the milligrams. She explained that it was ineffective so she stopped taking it on her own after a year. She reports that she was prescribed an additional medication to the Zoloft, Paxil possibly but is unable to remember the dosage or milligrams. She denies ever being hospitalized for mental illness or substance abuse. She is not interested in taking psychotropic medications at this point in time and just wants to do therapy.  Ms. Nemitz has a history of hypertension, deep vein  thrombosis, seizures, and is prediabetic. She is an established patient at [facility]. She has had an adverse drug reaction to Aspirin in the past. She is allergic to shellfish.   Ms. Forester lives with her boyfriend of ten years. She works part time at [employer] and [employer]. She has no children and has never been married. Her support system consists of her best friend a guy friend.   Ms. Yousuf reports that her mother had a history of Bipolar disorder. She explains that she was not raised by her mother due to her mental illness. She reported that her mom passed away a few years ago and never knew much about her other than why she was never in her life. She denies any other history of mental illness or substance abuse in the family.   PLAN: Case consultation with Dr. Octavia Heir, MD, psychiatric consultant on Tuesday September 8th @ 9 am.   Scheduled next visit:   [facility]

## 2018-10-06 ENCOUNTER — Telehealth: Payer: Self-pay | Admitting: Licensed Clinical Social Worker

## 2018-10-06 ENCOUNTER — Ambulatory Visit: Payer: Self-pay | Admitting: Licensed Clinical Social Worker

## 2018-10-06 NOTE — Telephone Encounter (Signed)
Clinician attempted to contact the patient twice during their scheduled phone visit and left voicemail with contact information. 

## 2018-10-06 NOTE — Telephone Encounter (Signed)
Clinician attempted to return the patient's call about rescheduling her missed appointment and left voicemail with contact information.

## 2018-10-20 ENCOUNTER — Telehealth: Payer: Self-pay | Admitting: Gerontology

## 2018-10-20 NOTE — Telephone Encounter (Signed)
Left VM on 9/29 @ 2:53 PM. Asked pt to call back and schedule an appointment.

## 2018-10-21 ENCOUNTER — Telehealth: Payer: Self-pay

## 2018-11-05 ENCOUNTER — Encounter: Payer: Self-pay | Admitting: Gerontology

## 2018-11-05 ENCOUNTER — Ambulatory Visit: Payer: Self-pay | Admitting: Gerontology

## 2018-11-05 ENCOUNTER — Other Ambulatory Visit: Payer: Self-pay

## 2018-11-05 VITALS — BP 118/67 | HR 64 | Ht 61.0 in | Wt 199.9 lb

## 2018-11-05 DIAGNOSIS — G43109 Migraine with aura, not intractable, without status migrainosus: Secondary | ICD-10-CM

## 2018-11-05 DIAGNOSIS — R569 Unspecified convulsions: Secondary | ICD-10-CM

## 2018-11-05 DIAGNOSIS — R7303 Prediabetes: Secondary | ICD-10-CM

## 2018-11-05 DIAGNOSIS — E1159 Type 2 diabetes mellitus with other circulatory complications: Secondary | ICD-10-CM

## 2018-11-05 DIAGNOSIS — Z86718 Personal history of other venous thrombosis and embolism: Secondary | ICD-10-CM

## 2018-11-05 DIAGNOSIS — I152 Hypertension secondary to endocrine disorders: Secondary | ICD-10-CM

## 2018-11-05 DIAGNOSIS — I10 Essential (primary) hypertension: Secondary | ICD-10-CM

## 2018-11-05 DIAGNOSIS — E1169 Type 2 diabetes mellitus with other specified complication: Secondary | ICD-10-CM

## 2018-11-05 DIAGNOSIS — E785 Hyperlipidemia, unspecified: Secondary | ICD-10-CM

## 2018-11-05 DIAGNOSIS — Z0000 Encounter for general adult medical examination without abnormal findings: Secondary | ICD-10-CM

## 2018-11-05 DIAGNOSIS — Z87898 Personal history of other specified conditions: Secondary | ICD-10-CM

## 2018-11-05 MED ORDER — VALPROIC ACID 250 MG PO CAPS: ORAL_CAPSULE | ORAL | 1 refills | Status: DC

## 2018-11-05 MED ORDER — SIMVASTATIN 40 MG PO TABS: 40.0000 mg | ORAL_TABLET | Freq: Every day | ORAL | 1 refills | Status: DC

## 2018-11-05 MED ORDER — HYDROCHLOROTHIAZIDE 25 MG PO TABS: 25.0000 mg | ORAL_TABLET | Freq: Every day | ORAL | 1 refills | Status: DC

## 2018-11-05 MED ORDER — CARBAMAZEPINE 200 MG PO TABS: ORAL_TABLET | ORAL | 1 refills | Status: DC

## 2018-11-05 MED ORDER — METFORMIN HCL 500 MG PO TABS: ORAL_TABLET | ORAL | 1 refills | Status: DC

## 2018-11-05 MED ORDER — LISINOPRIL 10 MG PO TABS: 10.0000 mg | ORAL_TABLET | Freq: Every day | ORAL | 1 refills | Status: DC

## 2018-11-05 NOTE — Patient Instructions (Signed)
Fat and Cholesterol Restricted Eating Plan Getting too much fat and cholesterol in your diet may cause health problems. Choosing the right foods helps keep your fat and cholesterol at normal levels. This can keep you from getting certain diseases. Your doctor may recommend an eating plan that includes:  Total fat: ______% or less of total calories a day.  Saturated fat: ______% or less of total calories a day.  Cholesterol: less than _________mg a day.  Fiber: ______g a day. What are tips for following this plan? Meal planning  At meals, divide your plate into four equal parts: ? Fill one-half of your plate with vegetables and green salads. ? Fill one-fourth of your plate with whole grains. ? Fill one-fourth of your plate with low-fat (lean) protein foods.  Eat fish that is high in omega-3 fats at least two times a week. This includes mackerel, tuna, sardines, and salmon.  Eat foods that are high in fiber, such as whole grains, beans, apples, broccoli, carrots, peas, and barley. General tips   Work with your doctor to lose weight if you need to.  Avoid: ? Foods with added sugar. ? Fried foods. ? Foods with partially hydrogenated oils.  Limit alcohol intake to no more than 1 drink a day for nonpregnant women and 2 drinks a day for men. One drink equals 12 oz of beer, 5 oz of wine, or 1 oz of hard liquor. Reading food labels  Check food labels for: ? Trans fats. ? Partially hydrogenated oils. ? Saturated fat (g) in each serving. ? Cholesterol (mg) in each serving. ? Fiber (g) in each serving.  Choose foods with healthy fats, such as: ? Monounsaturated fats. ? Polyunsaturated fats. ? Omega-3 fats.  Choose grain products that have whole grains. Look for the word "whole" as the first word in the ingredient list. Cooking  Cook foods using low-fat methods. These include baking, boiling, grilling, and broiling.  Eat more home-cooked foods. Eat at restaurants and buffets  less often.  Avoid cooking using saturated fats, such as butter, cream, palm oil, palm kernel oil, and coconut oil. Recommended foods  Fruits  All fresh, canned (in natural juice), or frozen fruits. Vegetables  Fresh or frozen vegetables (raw, steamed, roasted, or grilled). Green salads. Grains  Whole grains, such as whole wheat or whole grain breads, crackers, cereals, and pasta. Unsweetened oatmeal, bulgur, barley, quinoa, or brown rice. Corn or whole wheat flour tortillas. Meats and other protein foods  Ground beef (85% or leaner), grass-fed beef, or beef trimmed of fat. Skinless chicken or turkey. Ground chicken or turkey. Pork trimmed of fat. All fish and seafood. Egg whites. Dried beans, peas, or lentils. Unsalted nuts or seeds. Unsalted canned beans. Nut butters without added sugar or oil. Dairy  Low-fat or nonfat dairy products, such as skim or 1% milk, 2% or reduced-fat cheeses, low-fat and fat-free ricotta or cottage cheese, or plain low-fat and nonfat yogurt. Fats and oils  Tub margarine without trans fats. Light or reduced-fat mayonnaise and salad dressings. Avocado. Olive, canola, sesame, or safflower oils. The items listed above may not be a complete list of foods and beverages you can eat. Contact a dietitian for more information. Foods to avoid Fruits  Canned fruit in heavy syrup. Fruit in cream or butter sauce. Fried fruit. Vegetables  Vegetables cooked in cheese, cream, or butter sauce. Fried vegetables. Grains  White bread. White pasta. White rice. Cornbread. Bagels, pastries, and croissants. Crackers and snack foods that contain trans fat   and hydrogenated oils. Meats and other protein foods  Fatty cuts of meat. Ribs, chicken wings, bacon, sausage, bologna, salami, chitterlings, fatback, hot dogs, bratwurst, and packaged lunch meats. Liver and organ meats. Whole eggs and egg yolks. Chicken and turkey with skin. Fried meat. Dairy  Whole or 2% milk, cream,  half-and-half, and cream cheese. Whole milk cheeses. Whole-fat or sweetened yogurt. Full-fat cheeses. Nondairy creamers and whipped toppings. Processed cheese, cheese spreads, and cheese curds. Beverages  Alcohol. Sugar-sweetened drinks such as sodas, lemonade, and fruit drinks. Fats and oils  Butter, stick margarine, lard, shortening, ghee, or bacon fat. Coconut, palm kernel, and palm oils. Sweets and desserts  Corn syrup, sugars, honey, and molasses. Candy. Jam and jelly. Syrup. Sweetened cereals. Cookies, pies, cakes, donuts, muffins, and ice cream. The items listed above may not be a complete list of foods and beverages you should avoid. Contact a dietitian for more information. Summary  Choosing the right foods helps keep your fat and cholesterol at normal levels. This can keep you from getting certain diseases.  At meals, fill one-half of your plate with vegetables and green salads.  Eat high-fiber foods, like whole grains, beans, apples, carrots, peas, and barley.  Limit added sugar, saturated fats, alcohol, and fried foods. This information is not intended to replace advice given to you by your health care provider. Make sure you discuss any questions you have with your health care provider. Document Released: 07/09/2011 Document Revised: 09/10/2017 Document Reviewed: 09/24/2016 Elsevier Patient Education  2020 [employer]. Carbohydrate Counting for Diabetes Mellitus, Adult  Carbohydrate counting is a method of keeping track of how many carbohydrates you eat. Eating carbohydrates naturally increases the amount of sugar (glucose) in the blood. Counting how many carbohydrates you eat helps keep your blood glucose within normal limits, which helps you manage your diabetes (diabetes mellitus). It is important to know how many carbohydrates you can safely have in each meal. This is different for every person. A diet and nutrition specialist (registered dietitian) can help you make a  meal plan and calculate how many carbohydrates you should have at each meal and snack. Carbohydrates are found in the following foods:  Grains, such as breads and cereals.  Dried beans and soy products.  Starchy vegetables, such as potatoes, peas, and corn.  Fruit and fruit juices.  Milk and yogurt.  Sweets and snack foods, such as cake, cookies, candy, chips, and soft drinks. How do I count carbohydrates? There are two ways to count carbohydrates in food. You can use either of the methods or a combination of both. Reading "Nutrition Facts" on packaged food The "Nutrition Facts" list is included on the labels of almost all packaged foods and beverages in the [place] It includes:  The serving size.  Information about nutrients in each serving, including the grams (g) of carbohydrate per serving. To use the "Nutrition Facts":  Decide how many servings you will have.  Multiply the number of servings by the number of carbohydrates per serving.  The resulting number is the total amount of carbohydrates that you will be having. Learning standard serving sizes of other foods When you eat carbohydrate foods that are not packaged or do not include "Nutrition Facts" on the label, you need to measure the servings in order to count the amount of carbohydrates:  Measure the foods that you will eat with a food scale or measuring cup, if needed.  Decide how many standard-size servings you will eat.  Multiply the number of   servings by 15. Most carbohydrate-rich foods have about 15 g of carbohydrates per serving. ? For example, if you eat 8 oz (170 g) of strawberries, you will have eaten 2 servings and 30 g of carbohydrates (2 servings x 15 g = 30 g).  For foods that have more than one food mixed, such as soups and casseroles, you must count the carbohydrates in each food that is included. The following list contains standard serving sizes of common carbohydrate-rich foods. Each of these servings  has about 15 g of carbohydrates:   hamburger bun or  English muffin.   oz (15 mL) syrup.   oz (14 g) jelly.  1 slice of bread.  1 six-inch tortilla.  3 oz (85 g) cooked rice or pasta.  4 oz (113 g) cooked dried beans.  4 oz (113 g) starchy vegetable, such as peas, corn, or potatoes.  4 oz (113 g) hot cereal.  4 oz (113 g) mashed potatoes or  of a large baked potato.  4 oz (113 g) canned or frozen fruit.  4 oz (120 mL) fruit juice.  4-6 crackers.  6 chicken nuggets.  6 oz (170 g) unsweetened dry cereal.  6 oz (170 g) plain fat-free yogurt or yogurt sweetened with artificial sweeteners.  8 oz (240 mL) milk.  8 oz (170 g) fresh fruit or one small piece of fruit.  24 oz (680 g) popped popcorn. Example of carbohydrate counting Sample meal  3 oz (85 g) chicken breast.  6 oz (170 g) brown rice.  4 oz (113 g) corn.  8 oz (240 mL) milk.  8 oz (170 g) strawberries with sugar-free whipped topping. Carbohydrate calculation 1. Identify the foods that contain carbohydrates: ? Rice. ? Corn. ? Milk. ? Strawberries. 2. Calculate how many servings you have of each food: ? 2 servings rice. ? 1 serving corn. ? 1 serving milk. ? 1 serving strawberries. 3. Multiply each number of servings by 15 g: ? 2 servings rice x 15 g = 30 g. ? 1 serving corn x 15 g = 15 g. ? 1 serving milk x 15 g = 15 g. ? 1 serving strawberries x 15 g = 15 g. 4. Add together all of the amounts to find the total grams of carbohydrates eaten: ? 30 g + 15 g + 15 g + 15 g = 75 g of carbohydrates total. Summary  Carbohydrate counting is a method of keeping track of how many carbohydrates you eat.  Eating carbohydrates naturally increases the amount of sugar (glucose) in the blood.  Counting how many carbohydrates you eat helps keep your blood glucose within normal limits, which helps you manage your diabetes.  A diet and nutrition specialist (registered dietitian) can help you make a meal  plan and calculate how many carbohydrates you should have at each meal and snack. This information is not intended to replace advice given to you by your health care provider. Make sure you discuss any questions you have with your health care provider. Document Released: 01/07/2005 Document Revised: 08/01/2016 Document Reviewed: 06/21/2015 Elsevier Patient Education  2020 Elsevier Inc.  

## 2018-11-05 NOTE — Progress Notes (Signed)
Established Patient Office Visit  Subjective:  Patient ID: Ann Sanders, female    DOB: 1960/09/22  Age: 58 y.o. MRN: [id number]  CC:  Chief Complaint  Patient presents with  . Diabetes    HPI Ann Rainsaula Dianne Capitano presents for for follow up of Pre diabetes, Hypertension, history of seizure,DVT  and medication refill.Her last HgbA1c done on 08/27/18 was 5.7%, she continues to take500 mg metforminbid and shechecks blood glucose as needed.She denies any hypoglycemic episodes and peripheral neuropathy. She also admitsto taking25 mg hctz and 10 mg lisinopril daily, anddoesn't check blood pressure at home. She states that she adheres to [employer] diet and exercises as tolerated.She denies headache, dizziness, chest pain, cough and palpitation.             She has a history of seizure and shestates that her seizure is associated with an aura. She states that it feellike "she's coming out of her skin". She reports that herlast seizure activity was 30 years ago. Shecontinues to takecarbamazepineand Valproic acid, and her Valproic acid level done on 08/27/2018 was 73 ug/ml and Tegretol level was 8.3 ug/ml.             She alsohas a history of recurrent DVT andcontinues to take Eluiquis 5 mg bid.She denies peripheral edema,claudication,hematuria, hematochezia, and bruises. She states that she has made appointment for Mammogram screening and will schedule Colonoscopy when someone can go with her to the appointment. She reports that her left knee gives out sometimes when she walks and it has being going on since she was a child. She denies pain and defers seen an Orthopedic and will notify clinic if it worsens. She denies fever, chills and no further concern.   Past Medical History:  Diagnosis Date  . Diabetes mellitus without complication (HCC)    pre diabetes  . DVT (deep vein thrombosis) in pregnancy 2005  . Hypertension   . Migraine headache    none for past year  .  Seizures (HCC)    none for 25 yrs.  On medication.  . Vertigo    "long time ago"    Past Surgical History:  Procedure Laterality Date  . VASCULAR SURGERY Right 2005   stent in leg    Family History  Problem Relation Age of Onset  . Heart disease Paternal Grandfather   . Hypertension Paternal Grandfather     Social History   Socioeconomic History  . Marital status: Single    Spouse name: Not on file  . Number of children: 0  . Years of education: Not on file  . Highest education level: Some college, no degree  Occupational History  . Not on file  Social Needs  . Financial resource strain: Very hard  . Food insecurity    Worry: Often true    Inability: Often true  . Transportation needs    Medical: Yes    Non-medical: No  Tobacco Use  . Smoking status: Never Smoker  . Smokeless tobacco: Never Used  Substance and Sexual Activity  . Alcohol use: No    Comment: may have drink 1x/yr  . Drug use: No  . Sexual activity: Not Currently  Lifestyle  . Physical activity    Days per week: 0 days    Minutes per session: Not on file  . Stress: To some extent  Relationships  . Social Musicianconnections    Talks on phone: Not on file    Gets together: Not on file  Attends religious service: Not on file    Active member of club or organization: Not on file    Attends meetings of clubs or organizations: Not on file    Relationship status: Living with partner  . Intimate partner violence    Fear of current or ex partner: Patient refused    Emotionally abused: Patient refused    Physically abused: Patient refused    Forced sexual activity: Patient refused  Other Topics Concern  . Not on file  Social History Narrative  . Not on file    Outpatient Medications Prior to Visit  Medication Sig Dispense Refill  . apixaban (ELIQUIS) 5 MG TABS tablet TAKE ONE TABLET BY MOUTH 2 TIMES A DAY 60 tablet 0  . ascorbic acid (VITAMIN C) 1000 MG tablet Take 1 tablet (1,000 mg total) by  mouth daily. 90 tablet 2  . Biotin 40981 MCG TABS Take 10,000 mcg by mouth daily. 90 tablet 2  . Calcium Carbonate 500 MG CHEW Chew 1 tablet (500 mg total) by mouth daily. 90 tablet 2  . cholecalciferol (VITAMIN D) 400 units TABS tablet Take 2.5 tablets (1,000 Units total) by mouth daily. 90 each 2  . CINNAMON PO Take 1 tablet by mouth 2 (two) times daily.     . carbamazepine (TEGRETOL) 200 MG tablet TAKE TWO TABLETS BY MOUTH 2 TIMES A DAY 120 tablet 0  . hydrochlorothiazide (HYDRODIURIL) 25 MG tablet Take 1 tablet (25 mg total) by mouth daily. 90 tablet 1  . lisinopril (ZESTRIL) 10 MG tablet Take 1 tablet (10 mg total) by mouth daily. 30 tablet 3  . metFORMIN (GLUCOPHAGE) 500 MG tablet Take 1 tablet by mouth with food two times a day 60 tablet 2  . simvastatin (ZOCOR) 40 MG tablet Take 1 tablet (40 mg total) by mouth daily. 60 tablet 1  . valproic acid (DEPAKENE) 250 MG capsule Take 3 capsules by mouth in the morning and 2 capsules at night 150 capsule 1  . acetaminophen (TYLENOL) 500 MG tablet Take 1,000 mg by mouth every 6 (six) hours as needed.    . Cyanocobalamin (VITAMIN B 12 PO) Take 1,000 mcg by mouth.    . SUMAtriptan (IMITREX) 100 MG tablet Take 1 tablet (100 mg total) by mouth once for 1 dose. May repeat in 2 hours if headache persists or recurs. 10 tablet 0   No facility-administered medications prior to visit.     Allergies  Allergen Reactions  . Asa [Aspirin] Anaphylaxis  . Shellfish Allergy Itching    ROS Review of Systems  Constitutional: Negative.   HENT: Negative.   Eyes: Negative.   Respiratory: Negative.   Cardiovascular: Negative.   Gastrointestinal: Negative.   Endocrine: Negative.   Genitourinary: Negative.   Musculoskeletal: Negative.   Neurological: Negative.   Psychiatric/Behavioral: Negative.       Objective:    Physical Exam  Constitutional: She is oriented to person, place, and time. She appears well-developed and well-nourished.  HENT:  Head:  Normocephalic and atraumatic.  Eyes: Pupils are equal, round, and reactive to light. EOM are normal.  Cardiovascular: Normal rate and regular rhythm.  Pulmonary/Chest: Effort normal and breath sounds normal.  Abdominal: Soft. Bowel sounds are normal.  Musculoskeletal: Normal range of motion.  Neurological: She is alert and oriented to person, place, and time. She has normal reflexes.  Skin: Skin is warm and dry.  Psychiatric: She has a normal mood and affect. Her behavior is normal. Judgment and thought content normal.  BP 118/67 (BP Location: Left Arm, Patient Position: Sitting, Cuff Size: Normal)   Pulse 64   Ht 5\' 1"  (1.549 m)   Wt 199 lb 14.4 oz (90.7 kg)   BMI 37.77 kg/m  Wt Readings from Last 3 Encounters:  11/05/18 199 lb 14.4 oz (90.7 kg)  08/31/18 204 lb (92.5 kg)  01/15/18 196 lb 11.2 oz (89.2 kg)   She was advised to continue on her weight loss regimen  Health Maintenance Due  Topic Date Due  . Hepatitis C Screening  April 12, 1960  . OPHTHALMOLOGY EXAM  11/07/1972  . HIV Screening  11/07/1977  . PAP SMEAR-Modifier  11/08/1983  . MAMMOGRAM  11/07/2012  . COLONOSCOPY  11/07/2012    There are no preventive care reminders to display for this patient.  No results found for: TSH Lab Results  Component Value Date   WBC 6.6 08/27/2018   HGB 12.1 08/27/2018   HCT 35.2 08/27/2018   MCV 87 08/27/2018   PLT 237 08/27/2018   Lab Results  Component Value Date   NA 141 08/27/2018   K 4.2 08/27/2018   CO2 21 08/27/2018   GLUCOSE 106 (H) 08/27/2018   BUN 9 08/27/2018   CREATININE 0.71 08/27/2018   BILITOT <0.2 08/27/2018   ALKPHOS 54 08/27/2018   AST 13 08/27/2018   ALT 9 08/27/2018   PROT 6.0 08/27/2018   ALBUMIN 4.1 08/27/2018   CALCIUM 9.6 08/27/2018   ANIONGAP 11 03/29/2016   Lab Results  Component Value Date   CHOL 159 08/27/2018   Lab Results  Component Value Date   HDL 64 08/27/2018   Lab Results  Component Value Date   LDLCALC 72 08/27/2018    Lab Results  Component Value Date   TRIG 115 08/27/2018   Lab Results  Component Value Date   CHOLHDL 2.5 08/27/2018   Lab Results  Component Value Date   HGBA1C 5.7 (H) 08/27/2018      Assessment & Plan:     1. Health care maintenance -Flu and Pneumonia vaccines were administered. - Flu Vaccine QUAD 6+ mos PF IM (Fluarix Quad PF) - Pneumococcal polysaccharide vaccine 23-valent greater than or equal to 2yo subcutaneous/IM  2. Seizures ([place]) -She will continue on current treatment regimen and to go to the ED with seizure activities. - valproic acid (DEPAKENE) 250 MG capsule; Take 3 capsules by mouth in the morning and 2 capsules at night  Dispense: 300 capsule; Refill: 1 - carbamazepine (TEGRETOL) 200 MG tablet; Take 200 mg tablets twice daily  Dispense: 180 tablet; Refill: 1  3. Migraine aura without headache - She will continue on current treatment regimen  4. Hyperlipidemia associated with type 2 diabetes mellitus ([place]) - She will continue on current treatment regimen. -Low fat Diet, like low fat dairy products eg skimmed milk -Avoid any fried food -Regular exercise/walk -Goal for Total Cholesterol is less than 200 -Goal for bad cholesterol LDL is less than 70 -Goal for Good cholesterol HDL is more than 45 -Goal for Triglyceride is less than 150 - simvastatin (ZOCOR) 40 MG tablet; Take 1 tablet (40 mg total) by mouth daily.  Dispense: 90 tablet; Refill: 1  5. History of prediabetes - Her HgbA1c was 5.7%, she will continue on current treatment regimen, continue to check blood glucose, low carb/non concentrated sweet diet and exercise as tolerated. - metFORMIN (GLUCOPHAGE) 500 MG tablet; Take 1 tablet by mouth with food two times a day  Dispense: 180 tablet; Refill: 1  6. Hypertension  associated with diabetes (HCC) - She will continue on current treatment regimen, -Low salt DASH diet -Take medications regularly on time -Exercise regularly as tolerated -Check  blood pressure at least once a week at home or a nearby pharmacy and record -Goal is less than 140/90 and normal blood pressure is 120/80 - lisinopril (ZESTRIL) 10 MG tablet; Take 1 tablet (10 mg total) by mouth daily.  Dispense: 90 tablet; Refill: 1 - hydrochlorothiazide (HYDRODIURIL) 25 MG tablet; Take 1 tablet (25 mg total) by mouth daily.  Dispense: 90 tablet; Refill: 1       Follow-up: Return in about 4 months (around 03/08/2019), or if symptoms worsen or fail to improve.    Mattea Seger Trellis Paganini, NP

## 2018-11-24 ENCOUNTER — Other Ambulatory Visit: Payer: Self-pay | Admitting: Gerontology

## 2018-11-24 ENCOUNTER — Other Ambulatory Visit: Payer: Self-pay

## 2018-11-24 DIAGNOSIS — R569 Unspecified convulsions: Secondary | ICD-10-CM

## 2018-11-24 MED ORDER — CARBAMAZEPINE 200 MG PO TABS: ORAL_TABLET | ORAL | 1 refills | Status: DC

## 2018-11-24 NOTE — Progress Notes (Unsigned)
NEU

## 2018-12-03 ENCOUNTER — Other Ambulatory Visit: Payer: Self-pay

## 2018-12-03 ENCOUNTER — Ambulatory Visit: Payer: Self-pay | Admitting: Pharmacist

## 2018-12-03 DIAGNOSIS — Z79899 Other long term (current) drug therapy: Secondary | ICD-10-CM

## 2018-12-03 NOTE — Progress Notes (Signed)
[facility] Visit Note  Patient: Ann Sanders MRN: [id number] Date of Birth: April 26, 1960 PCP: Ann Reusing, NP   Ann Sanders 58 y.o. female presents for an initial MTM visit today.  There were no vitals taken for this visit.  Patient Information   Past Medical History:  Diagnosis Date  . Diabetes mellitus without complication ([place])    pre diabetes  . DVT (deep vein thrombosis) in pregnancy 2005  . Hypertension   . Migraine headache    none for past year  . Seizures ([place])    none for 25 yrs.  On medication.  . Vertigo    "long time ago"      Past Surgical History:  Procedure Laterality Date  . VASCULAR SURGERY Right 2005   stent in leg     Family History  Problem Relation Age of Onset  . Heart disease Paternal Grandfather   . Hypertension Paternal Grandfather     Exercise:  Patient tries to walk every day.  Complains of pain in knee.  Lifestyle Diet:  Patient tries to eat in moderation.  Patient usually eats vegetables, fruits, pepperoni, and a piece of cheese.  Usually dips vegetables in ranch dressing.  Instant tuna out of the bag.  She drinks 16.9 fluid oz Great Value Brand water.  She drinks Dr. Krystal Clark at home.              Social History   Substance and Sexual Activity  Alcohol Use No   Comment: may have drink 1x/yr      Social History   Tobacco Use  Smoking Status Never Smoker  Smokeless Tobacco Never Used      Health Maintenance  Topic Date Due  . Hepatitis C Screening  01/31/[birth year]  . OPHTHALMOLOGY EXAM  11/07/1972  . HIV Screening  11/07/1977  . PAP SMEAR-Modifier  11/08/1983  . MAMMOGRAM  11/07/2012  . COLONOSCOPY  11/07/2012  . HEMOGLOBIN A1C  02/27/2019  . FOOT EXAM  11/05/2019  . TETANUS/TDAP  09/18/2024  . INFLUENZA VACCINE  Completed  . PNEUMOCOCCAL POLYSACCHARIDE VACCINE AGE 4-64 HIGH RISK  Completed     Assessment and Plan:  Last ED visit:  ~2 years ago.  Passed out while  working at [employer].  This happened 2-3 times.  Last Visit to PCP:  [facility] 10/15  Next Visit to PCP:   January or February  Specialist Visit:  Possibly neurologist  Dental Exam:  Last ~ 2 years ago following loss of job at [employer].    Eye Exam:  Last ~ 2 years ago  Prostate Exam:  NA  Pelvic/PAP Exam:  Unknown.    Mammogram:  ~ 1 year ago  DEXA:  None   Colonoscopy:  None    Flu Vaccine:  Yes  Pneumonia Vaccine: Yes   She denies smoking, and alcohol use.     Adherence:  A: Patient reports not missing any doses.  She uses an AM/PM pill box that she sets up every Sunday.    P: Patient instructed to continue medications as prescribed.  Pre-diabetes:  Last A1c 08/2018 5.7%:    A: Patient taking metformin since around 2011 or 2012.  Patient takes without a meal, and denies nausea, and stomach cramping.  Patient checks BG when she feels "wonky".  Patient does not record BG logs, and is unsure of BG values during these episodes.  She reports having hypoglycemia in the 1990s.      P: Encourage avoidance of  high sugar beverages when possible with additional water intake.  Educated patient regarding hypoglycemia management and how to identify symptoms of hypoglycemia.  Inject 4 oz of juice or soda.  Recheck BG in 15 minutes.     HTN:    A:  Currently on HCTZ, lisinopril.  Patient does not check blood pressure at home.  Per patient, doctor told her she did not need to check at home because her blood pressure runs low.  Unsure what her blood pressures are on average.    P:  Patient advised to seek help from [facility] or [facility] in the event of hypotensive event.  Advised to call if dizzy and weak, especially upon standing.   Seizure w/ aura:   A:  Per patient, last seizure was 25 years ago.  Recent carbamazepine and VPA levels wnl from 08/2018.  Patient has taken carbapazepine and VPA since she was 58 years old.  No dose changes recently, she reports last dose change roughly 8 years ago.   P:  Continue current  regimen.   Recurrent DVT:    A:  Currently on Eliquis.  She reports 2 DVTs in left leg, and 3 DVTs in the right leg.  She has been taking Eliquis around 3-4 years after being on warfarin prevoiously.  She states that she will require lifelong anticoagulation therapy. P:  Continue current regimen.    HLD:    A:  Simvastatin.  Last lipid panel normal.  She is not currently experiencing any myalgias.      P:  Continue current regimen.  Migraines/Headaches:  A:  Last migraine was ~ 1 year ago.  Currently on sumatriptan.  Patient takes tylenol for small headaches.  P:  Continue current regimen.  Depression:  A:  She was on Zoloft after mother died in 10-May-1999.  However, she did not tolerate Zoloft very well, and she no longer takes it.  She has been following up with [facility].   Left Knee pain:    A:  Patient reports 8/10 pain, and she feels sick.  She feels like knee pops out when she walks.  She remedies this by popping it back in place.  Patient denies worsening condition with weather, and reports more of an acute pain than a chronic aching pain. P:   Patient advised to use heating pads.  Tylenol can also be used 325-650 mg every 4 hours as needed.  Patient advised not to exceed 4,000 mg tylenol in one day.  Follow-up:  Follow-up in 1 year.   Patient also requests Epi-Pen.  Informed to acquire Rx from provider, and [facility] will attempt to fill this.    Cherly Hensen, PharmD Pharmacy Resident  12/03/2018 3:36 PM

## 2018-12-04 ENCOUNTER — Other Ambulatory Visit: Payer: Self-pay

## 2019-01-07 ENCOUNTER — Other Ambulatory Visit: Payer: Self-pay

## 2019-01-28 ENCOUNTER — Other Ambulatory Visit: Payer: Self-pay | Admitting: Gerontology

## 2019-01-28 DIAGNOSIS — R569 Unspecified convulsions: Secondary | ICD-10-CM

## 2019-03-01 ENCOUNTER — Other Ambulatory Visit: Payer: Self-pay

## 2019-03-01 DIAGNOSIS — R569 Unspecified convulsions: Secondary | ICD-10-CM

## 2019-03-01 MED ORDER — VALPROIC ACID 250 MG PO CAPS: ORAL_CAPSULE | ORAL | 1 refills | Status: DC

## 2019-03-04 ENCOUNTER — Ambulatory Visit: Payer: Self-pay | Admitting: Family Medicine

## 2019-03-04 ENCOUNTER — Telehealth: Payer: Self-pay | Admitting: Gerontology

## 2019-03-04 ENCOUNTER — Other Ambulatory Visit: Payer: Self-pay

## 2019-03-08 ENCOUNTER — Ambulatory Visit: Payer: Self-pay | Admitting: Gerontology

## 2019-04-19 ENCOUNTER — Other Ambulatory Visit: Payer: Self-pay | Admitting: Gerontology

## 2019-04-19 DIAGNOSIS — R569 Unspecified convulsions: Secondary | ICD-10-CM

## 2019-05-18 ENCOUNTER — Telehealth: Payer: Self-pay | Admitting: Pharmacist

## 2019-05-18 NOTE — Telephone Encounter (Signed)
05/18/2019 8:48:23 AM - Alver Fisher refill to dr for Eliquis  -- Rhetta Mura - Tuesday, May 18, 2019 8:47 AM --Received a refill request from [facility] for Eliquis 5mg   Take 1 tablet two times daily-sending refill to [facility] for provider to sign.

## 2019-05-24 ENCOUNTER — Telehealth: Payer: Self-pay | Admitting: Pharmacist

## 2019-05-24 NOTE — Telephone Encounter (Signed)
05/24/2019 9:51:54 AM - Eliquis refill faxed to [facility]  -- Rhetta Mura - Monday, May 24, 2019 9:50 AM --Faxed [facility] refill request for Eliquis 5mg  Take one tablet two times daily.

## 2019-06-22 ENCOUNTER — Other Ambulatory Visit: Payer: Self-pay | Admitting: Gerontology

## 2019-06-22 DIAGNOSIS — R569 Unspecified convulsions: Secondary | ICD-10-CM

## 2019-06-25 ENCOUNTER — Telehealth: Payer: Self-pay | Admitting: Pharmacy Technician

## 2019-06-25 NOTE — Telephone Encounter (Signed)
Patient stated that insurance coverage has ended with [employer].  I spoke with Alycia Rossetti at [employer] and he indicated that policy still in effect.  Made patient aware.  Patient is to provide documentation showing coverage has ended.  Sherilyn Dacosta Care Manager [facility]

## 2019-06-29 ENCOUNTER — Other Ambulatory Visit: Payer: Self-pay | Admitting: Gerontology

## 2019-06-29 ENCOUNTER — Telehealth: Payer: Self-pay | Admitting: Pharmacy Technician

## 2019-06-29 DIAGNOSIS — I152 Hypertension secondary to endocrine disorders: Secondary | ICD-10-CM

## 2019-06-29 NOTE — Telephone Encounter (Signed)
Received updated proof of income.  Patient eligible to receive medication assistance at [facility] until time for re-certification in 9359, and as long as eligibility requirements continue to be met.  [place] [facility]

## 2019-07-22 ENCOUNTER — Other Ambulatory Visit: Payer: Self-pay

## 2019-07-22 DIAGNOSIS — R569 Unspecified convulsions: Secondary | ICD-10-CM

## 2019-07-22 NOTE — Progress Notes (Unsigned)
lip

## 2019-07-23 LAB — COMPREHENSIVE METABOLIC PANEL
ALT: 15 IU/L (ref 0–32)
AST: 16 IU/L (ref 0–40)
Albumin/Globulin Ratio: 1.9 (ref 1.2–2.2)
Albumin: 4.2 g/dL (ref 3.8–4.9)
Alkaline Phosphatase: 57 IU/L (ref 48–121)
BUN/Creatinine Ratio: 19 (ref 9–23)
BUN: 14 mg/dL (ref 6–24)
Bilirubin Total: 0.2 mg/dL (ref 0.0–1.2)
CO2: 25 mmol/L (ref 20–29)
Calcium: 8.9 mg/dL (ref 8.7–10.2)
Chloride: 98 mmol/L (ref 96–106)
Creatinine, Ser: 0.75 mg/dL (ref 0.57–1.00)
GFR calc Af Amer: 103 mL/min/{1.73_m2} (ref >59)
GFR calc non Af Amer: 89 mL/min/{1.73_m2} (ref >59)
Globulin, Total: 2.2 g/dL (ref 1.5–4.5)
Glucose: 80 mg/dL (ref 65–99)
Potassium: 4.1 mmol/L (ref 3.5–5.2)
Sodium: 139 mmol/L (ref 134–144)
Total Protein: 6.4 g/dL (ref 6.0–8.5)

## 2019-07-23 LAB — CARBAMAZEPINE LEVEL, TOTAL: Carbamazepine (Tegretol), S: 7.7 ug/mL (ref 4.0–12.0)

## 2019-07-23 LAB — CBC WITH DIFFERENTIAL/PLATELET
Basophils Absolute: 0.1 10*3/uL (ref 0.0–0.2)
Basos: 1 %
EOS (ABSOLUTE): 0.1 10*3/uL (ref 0.0–0.4)
Eos: 2 %
Hematocrit: 36 % (ref 34.0–46.6)
Hemoglobin: 11.9 g/dL (ref 11.1–15.9)
Immature Grans (Abs): 0.1 10*3/uL (ref 0.0–0.1)
Immature Granulocytes: 1 %
Lymphocytes Absolute: 3.1 10*3/uL (ref 0.7–3.1)
Lymphs: 38 %
MCH: 29.8 pg (ref 26.6–33.0)
MCHC: 33.1 g/dL (ref 31.5–35.7)
MCV: 90 fL (ref 79–97)
Monocytes Absolute: 0.5 10*3/uL (ref 0.1–0.9)
Monocytes: 6 %
Neutrophils Absolute: 4.3 10*3/uL (ref 1.4–7.0)
Neutrophils: 52 %
Platelets: 277 10*3/uL (ref 150–450)
RBC: 3.99 x10E6/uL (ref 3.77–5.28)
RDW: 13.1 % (ref 11.7–15.4)
WBC: 8.1 10*3/uL (ref 3.4–10.8)

## 2019-07-23 LAB — LIPID PANEL
Chol/HDL Ratio: 2.7 ratio (ref 0.0–4.4)
Cholesterol, Total: 180 mg/dL (ref 100–199)
HDL: 66 mg/dL (ref >39)
LDL Chol Calc (NIH): 89 mg/dL (ref 0–99)
Triglycerides: 145 mg/dL (ref 0–149)
VLDL Cholesterol Cal: 25 mg/dL (ref 5–40)

## 2019-07-23 LAB — VALPROIC ACID LEVEL: Valproic Acid Lvl: 36 ug/mL — ABNORMAL LOW (ref 50–100)

## 2019-07-23 LAB — HEMOGLOBIN A1C
Est. average glucose Bld gHb Est-mCnc: 120 mg/dL
Hgb A1c MFr Bld: 5.8 % — ABNORMAL HIGH (ref 4.8–5.6)

## 2019-07-29 ENCOUNTER — Ambulatory Visit: Payer: Self-pay

## 2019-07-29 ENCOUNTER — Other Ambulatory Visit: Payer: Self-pay

## 2019-07-29 ENCOUNTER — Other Ambulatory Visit: Payer: Self-pay | Admitting: Gerontology

## 2019-07-29 ENCOUNTER — Ambulatory Visit: Payer: Self-pay | Admitting: Family Medicine

## 2019-07-29 VITALS — BP 113/77 | HR 64 | Ht 62.0 in | Wt 210.8 lb

## 2019-07-29 DIAGNOSIS — Z7901 Long term (current) use of anticoagulants: Secondary | ICD-10-CM

## 2019-07-29 DIAGNOSIS — E785 Hyperlipidemia, unspecified: Secondary | ICD-10-CM

## 2019-07-29 DIAGNOSIS — E1159 Type 2 diabetes mellitus with other circulatory complications: Secondary | ICD-10-CM

## 2019-07-29 DIAGNOSIS — R569 Unspecified convulsions: Secondary | ICD-10-CM

## 2019-07-29 DIAGNOSIS — E1169 Type 2 diabetes mellitus with other specified complication: Secondary | ICD-10-CM

## 2019-07-29 DIAGNOSIS — Z87898 Personal history of other specified conditions: Secondary | ICD-10-CM

## 2019-07-29 DIAGNOSIS — I152 Hypertension secondary to endocrine disorders: Secondary | ICD-10-CM

## 2019-07-29 MED ORDER — CARBAMAZEPINE 200 MG PO TABS: ORAL_TABLET | ORAL | 2 refills | Status: DC

## 2019-07-29 MED ORDER — APIXABAN 5 MG PO TABS: ORAL_TABLET | ORAL | 0 refills | Status: AC

## 2019-07-29 MED ORDER — VALPROIC ACID 250 MG PO CAPS: ORAL_CAPSULE | ORAL | 2 refills | Status: DC

## 2019-07-29 MED ORDER — LISINOPRIL 10 MG PO TABS: 10.0000 mg | ORAL_TABLET | Freq: Every day | ORAL | 0 refills | Status: AC

## 2019-07-29 MED ORDER — SIMVASTATIN 40 MG PO TABS: 40.0000 mg | ORAL_TABLET | Freq: Every day | ORAL | 1 refills | Status: DC

## 2019-07-29 MED ORDER — METFORMIN HCL 500 MG PO TABS: ORAL_TABLET | ORAL | 1 refills | Status: AC

## 2019-07-29 MED ORDER — METFORMIN HCL 500 MG PO TABS: ORAL_TABLET | ORAL | 1 refills | Status: DC

## 2019-07-29 NOTE — Patient Instructions (Signed)
Seizure, Adult °A seizure is a sudden burst of abnormal electrical activity in the brain. Seizures usually last from 30 seconds to 2 minutes. They can cause many different symptoms. °Usually, seizures are not harmful unless they last a long time. °What are the causes? °Common causes of this condition include: °· Fever or infection. °· Conditions that affect the brain, such as: °? A brain abnormality that you were born with. °? A brain or head injury. °? Bleeding in the brain. °? A tumor. °? Stroke. °? Brain disorders such as autism or cerebral palsy. °· Low blood sugar. °· Conditions that are passed from parent to child (are inherited). °· Problems with substances, such as: °? Having a reaction to a drug or a medicine. °? Suddenly stopping the use of a substance (withdrawal). °In some cases, the cause may not be known. A person who has repeated seizures over time without a clear cause has a condition called epilepsy. °What increases the risk? °You are more likely to get this condition if you have: °· A family history of epilepsy. °· Had a seizure in the past. °· A brain disorder. °· A history of head injury, lack of oxygen at birth, or strokes. °What are the signs or symptoms? °There are many types of seizures. The symptoms vary depending on the type of seizure you have. Examples of symptoms during a seizure include: °· Shaking (convulsions). °· Stiffness in the body. °· Passing out (losing consciousness). °· Head nodding. °· Staring. °· Not responding to sound or touch. °· Loss of bladder control and bowel control. °Some people have symptoms right before and right after a seizure happens. °Symptoms before a seizure may include: °· Fear. °· Worry (anxiety). °· Feeling like you may vomit (nauseous). °· Feeling like the room is spinning (vertigo). °· Feeling like you saw or heard something before (déjà vu). °· Odd tastes or smells. °· Changes in how you see. You may see flashing lights or spots. °Symptoms after a  seizure happens can include: °· Confusion. °· Sleepiness. °· Headache. °· Weakness on one side of the body. °How is this treated? °Most seizures will stop on their own in under 5 minutes. In these cases, no treatment is needed. Seizures that last longer than 5 minutes will usually need treatment. Treatment can include: °· Medicines given through an IV tube. °· Avoiding things that are known to cause your seizures. These can include medicines that you take for another condition. °· Medicines to treat epilepsy. °· Surgery to stop the seizures. This may be needed if medicines do not help. °Follow these instructions at home: °Medicines °· Take over-the-counter and prescription medicines only as told by your doctor. °· Do not eat or drink anything that may keep your medicine from working, such as alcohol. °Activity °· Do not do any activities that would be dangerous if you had another seizure, like driving or swimming. Wait until your doctor says it is safe for you to do them. °· If you live in the [place] ask your local DMV (department of motor vehicles) when you can drive. °· Get plenty of rest. °Teaching others °Teach friends and family what to do when you have a seizure. They should: °· Lay you on the ground. °· Protect your head and body. °· Loosen any tight clothing around your neck. °· Turn you on your side. °· Not hold you down. °· Not put anything into your mouth. °· Know whether or not you need emergency care. °· Stay   with you until you are better. ° °General instructions °· Contact your doctor each time you have a seizure. °· Avoid anything that gives you seizures. °· Keep a seizure diary. Write down: °? What you think caused each seizure. °? What you remember about each seizure. °· Keep all follow-up visits as told by your doctor. This is important. °Contact a doctor if: °· You have another seizure. °· You have seizures more often. °· There is any change in what happens during your seizures. °· You keep having  seizures with treatment. °· You have symptoms of being sick or having an infection. °Get help right away if: °· You have a seizure that: °? Lasts longer than 5 minutes. °? Is different than seizures you had before. °? Makes it harder to breathe. °? Happens after you hurt your head. °· You have any of these symptoms after a seizure: °? Not being able to speak. °? Not being able to use a part of your body. °? Confusion. °? A bad headache. °· You have two or more seizures in a row. °· You do not wake up right after a seizure. °· You get hurt during a seizure. °These symptoms may be an emergency. Do not wait to see if the symptoms will go away. Get medical help right away. Call your local emergency services (911 in the [place] Do not drive yourself to the hospital. °Summary °· Seizures usually last from 30 seconds to 2 minutes. Usually, they are not harmful unless they last a long time. °· Do not eat or drink anything that may keep your medicine from working, such as alcohol. °· Teach friends and family what to do when you have a seizure. °· Contact your doctor each time you have a seizure. °This information is not intended to replace advice given to you by your health care provider. Make sure you discuss any questions you have with your health care provider. °Document Revised: 03/27/2018 Document Reviewed: 03/27/2018 °Elsevier Patient Education © 2020 [employer] ° °

## 2019-07-29 NOTE — Progress Notes (Signed)
[facility]   Progress Note: General Provider: Mike Gip, FNP  SUBJECTIVE:   Ann Sanders is a 59 y.o. female who  has a past medical history of Diabetes mellitus without complication (HCC), DVT (deep vein thrombosis) in pregnancy (2005), Hypertension, Migraine headache, Seizures (HCC), and Vertigo.. Patient presents today for lab follow up  Patient presents for lab follow up for valproic acid levels. Patient states that she missed a few doses of medication prior to having labs. She reports that she has not had a seizure in over 20 years. She states that she is doing well on her current medications and would like to continue with them as they are.  Review of Systems  Constitutional: Negative.   HENT: Negative.   Eyes: Negative.   Respiratory: Negative.   Cardiovascular: Negative.   Gastrointestinal: Negative.   Genitourinary: Negative.   Musculoskeletal: Negative.   Skin: Negative.   Neurological: Negative.   Psychiatric/Behavioral: Negative.      OBJECTIVE: BP 113/77 (BP Location: Right Arm, Patient Position: Sitting)   Pulse 64   Ht 5\' 2"  (1.575 m)   Wt 210 lb 12.8 oz (95.6 kg)   SpO2 97%   BMI 38.56 kg/m   Wt Readings from Last 3 Encounters:  07/29/19 210 lb 12.8 oz (95.6 kg)  11/05/18 199 lb 14.4 oz (90.7 kg)  08/31/18 204 lb (92.5 kg)     Physical Exam Vitals and nursing note reviewed.  Constitutional:      General: She is not in acute distress.    Appearance: She is well-developed.  HENT:     Head: Normocephalic and atraumatic.  Eyes:     Conjunctiva/sclera: Conjunctivae normal.     Pupils: Pupils are equal, round, and reactive to light.  Neck:     Thyroid: No thyromegaly.     Vascular: No JVD.  Cardiovascular:     Rate and Rhythm: Normal rate and regular rhythm.     Heart sounds: Normal heart sounds. No murmur heard.  No friction rub. No gallop.   Pulmonary:     Effort: Pulmonary effort is normal. No respiratory  distress.     Breath sounds: Normal breath sounds. No wheezing or rales.  Chest:     Chest wall: No tenderness.  Abdominal:     General: Bowel sounds are normal. There is no distension.     Palpations: Abdomen is soft. There is no mass.     Tenderness: There is no abdominal tenderness.  Musculoskeletal:        General: Normal range of motion.     Cervical back: Normal range of motion and neck supple.  Lymphadenopathy:     Cervical: No cervical adenopathy.  Skin:    General: Skin is warm and dry.  Neurological:     Mental Status: She is alert and oriented to person, place, and time.  Psychiatric:        Mood and Affect: Mood normal.        Behavior: Behavior normal.        Thought Content: Thought content normal.        Judgment: Judgment normal.     ASSESSMENT/PLAN:  1. Seizures (HCC) No medication changes warranted at the present time.   - valproic acid (DEPAKENE) 250 MG capsule; Take 3 capsules by mouth in the morning and 2 capsules at night  Dispense: 300 capsule; Refill: 2 - carbamazepine (TEGRETOL) 200 MG tablet; Take Two 200 mg tablets (400mg  total) by mouth  twice daily  Dispense: 120 tablet; Refill: 2 - Valproic Acid level; Future - Carbamazepine Level (Tegretol), total; Future  2. Encounter for current long-term use of anticoagulants No medication changes warranted at the present time.   - apixaban (ELIQUIS) 5 MG TABS tablet; TAKE ONE TABLET BY MOUTH 2 TIMES A DAY  Dispense: 60 tablet; Refill: 0  3. Hypertension associated with diabetes (HCC) No medication changes warranted at the present time.   - lisinopril (ZESTRIL) 10 MG tablet; Take 1 tablet (10 mg total) by mouth daily.  Dispense: 90 tablet; Refill: 0  4. History of prediabetes - metFORMIN (GLUCOPHAGE) 500 MG tablet; Take 1 tablet by mouth with food two times a day  Dispense: 180 tablet; Refill: 1  5. Hyperlipidemia associated with type 2 diabetes mellitus (HCC) - simvastatin (ZOCOR) 40 MG tablet; Take 1  tablet (40 mg total) by mouth daily.  Dispense: 90 tablet; Refill: 1    Return in about 2 weeks (around 08/12/2019), or labs next week, for lab follow up.    The patient was given clear instructions to go to ER or return to medical center if symptoms do not improve, worsen or new problems develop. The patient verbalized understanding and agreed with plan of care.   Ms. Ann Sanders. Riley Lam, FNP-BC [facility]

## 2019-08-05 ENCOUNTER — Other Ambulatory Visit: Payer: Self-pay

## 2019-08-12 ENCOUNTER — Telehealth: Payer: Self-pay | Admitting: Pharmacist

## 2019-08-12 ENCOUNTER — Ambulatory Visit: Payer: Self-pay

## 2019-08-12 NOTE — Telephone Encounter (Signed)
08/12/2019 10:35:34 AM - Eliquis renewal to pt & dr  -- Rhetta Mura - Thursday, August 12, 2019 10:34 AM --Received notice from [employer] that patient enrollment ends 09/03/2019--Printed [employer] application-put patient portion in bag with meds on the wall for patient to sign & date, also sending provider portion to [facility] for [place] to sign.

## 2019-08-26 ENCOUNTER — Other Ambulatory Visit: Payer: Self-pay

## 2019-08-26 DIAGNOSIS — R569 Unspecified convulsions: Secondary | ICD-10-CM

## 2019-08-27 ENCOUNTER — Telehealth: Payer: Self-pay | Admitting: Pharmacist

## 2019-08-27 LAB — VALPROIC ACID LEVEL: Valproic Acid Lvl: 34 ug/mL — ABNORMAL LOW (ref 50–100)

## 2019-08-27 LAB — CARBAMAZEPINE LEVEL, TOTAL: Carbamazepine (Tegretol), S: 6.5 ug/mL (ref 4.0–12.0)

## 2019-08-27 NOTE — Telephone Encounter (Signed)
08/27/2019 9:10:15 AM - Eliquis renewal faxed to [employer]  -- Rhetta Mura - Friday, August 27, 2019 9:09 AM --Faxed renewal application to [facility] for Eliquis 5mg  Take one tablet two times daily.

## 2019-09-02 ENCOUNTER — Ambulatory Visit: Payer: Self-pay

## 2019-09-06 ENCOUNTER — Other Ambulatory Visit: Payer: Self-pay | Admitting: Pharmacist

## 2019-09-09 ENCOUNTER — Ambulatory Visit: Payer: Self-pay | Admitting: Unknown Physician Specialty

## 2019-09-09 ENCOUNTER — Other Ambulatory Visit: Payer: Self-pay

## 2019-09-09 DIAGNOSIS — Z87898 Personal history of other specified conditions: Secondary | ICD-10-CM

## 2019-09-09 NOTE — Progress Notes (Signed)
BP 109/75 (BP Location: Right Arm)   Pulse (!) 58   Temp 98.9 F (37.2 C)   Resp 12   Ht 5\' 1"  (1.549 m)   Wt 212 lb 1.6 oz (96.2 kg)   SpO2 97%   BMI 40.08 kg/m    Subjective:    Patient ID: , female    DOB: Oct 15, 1960, 59 y.o.   MRN: [id number]  HPI: Ann Sanders is a 59 y.o. female  Chief Complaint  Patient presents with  . Follow-up    labs- valproic acid levels   Seizure disorder - Pt has had no seizures for 30 years.  The problems seems to be that Valporoic acid has been low last 2 blood draws.  This is perhaps because she has her labs drawn before taking her medication which she takes later due to being here.  Pt is doing well without adverse side-effects.    Relevant past medical, surgical, family and social history reviewed and updated as indicated. Interim medical history since our last visit reviewed. Allergies and medications reviewed and updated.  Review of Systems  Per HPI unless specifically indicated above     Objective:    BP 109/75 (BP Location: Right Arm)   Pulse (!) 58   Temp 98.9 F (37.2 C)   Resp 12   Ht 5\' 1"  (1.549 m)   Wt 212 lb 1.6 oz (96.2 kg)   SpO2 97%   BMI 40.08 kg/m   Wt Readings from Last 3 Encounters:  09/09/19 212 lb 1.6 oz (96.2 kg)  07/29/19 210 lb 12.8 oz (95.6 kg)  11/05/18 199 lb 14.4 oz (90.7 kg)    Physical Exam Constitutional:      General: She is not in acute distress.    Appearance: Normal appearance. She is well-developed.  HENT:     Head: Normocephalic and atraumatic.  Eyes:     General: Lids are normal. No scleral icterus.       Right eye: No discharge.        Left eye: No discharge.     Conjunctiva/sclera: Conjunctivae normal.  Cardiovascular:     Rate and Rhythm: Normal rate.  Pulmonary:     Effort: Pulmonary effort is normal.  Abdominal:     Palpations: There is no hepatomegaly or splenomegaly.  Musculoskeletal:        General: Normal range of motion.  Skin:     Coloration: Skin is not pale.     Findings: No rash.  Neurological:     Mental Status: She is alert and oriented to person, place, and time.  Psychiatric:        Behavior: Behavior normal.        Thought Content: Thought content normal.        Judgment: Judgment normal.     Results for orders placed or performed in visit on 08/26/19  Carbamazepine Level (Tegretol), total  Result Value Ref Range   Carbamazepine (Tegretol), S 6.5 4.0 - 12.0 ug/mL  Valproic Acid level  Result Value Ref Range   Valproic Acid Lvl 34 (L) 50 - 100 ug/mL      Assessment & Plan:   Problem List Items Addressed This Visit      Unprioritized   History of seizure    No seizure for 30 years.  Pt will try to come in to get labs drawn at a different time of day.        Relevant Orders  Valproic Acid level       Follow up plan: Return in about 6 months (around 03/11/2020), or for DM and will come in for Valproic acid levels at a different time/day.Marland Kitchen

## 2019-09-09 NOTE — Assessment & Plan Note (Signed)
No seizure for 30 years.  Pt will try to come in to get labs drawn at a different time of day.

## 2019-09-29 ENCOUNTER — Ambulatory Visit: Payer: Self-pay | Admitting: Pharmacist

## 2019-10-07 ENCOUNTER — Ambulatory Visit: Payer: Self-pay

## 2019-11-10 ENCOUNTER — Other Ambulatory Visit: Payer: Self-pay | Admitting: Adult Health

## 2019-11-10 ENCOUNTER — Other Ambulatory Visit: Payer: Self-pay | Admitting: Gerontology

## 2019-11-10 DIAGNOSIS — R569 Unspecified convulsions: Secondary | ICD-10-CM

## 2019-11-10 DIAGNOSIS — E1159 Type 2 diabetes mellitus with other circulatory complications: Secondary | ICD-10-CM

## 2020-01-13 ENCOUNTER — Other Ambulatory Visit: Payer: Self-pay | Admitting: Gerontology

## 2020-01-13 DIAGNOSIS — R569 Unspecified convulsions: Secondary | ICD-10-CM

## 2020-01-19 ENCOUNTER — Telehealth: Payer: Self-pay | Admitting: Gerontology

## 2020-01-19 NOTE — Telephone Encounter (Signed)
Patient called to get a refill on her medications:  valproic acid (DEPAKENE) 250 MG capsule  carbamazepine (TEGRETOL) 200 MG tablet  hydrochlorothiazide (HYDRODIURIL) 25 MG tablet  lisinopril (ZESTRIL) 10 MG tablet metFORMIN (GLUCOPHAGE) 500 MG tablet  simvastatin (ZOCOR) 40 MG tablet  apixaban (ELIQUIS) 5 MG TABS tablet    Patient is inform that this is a one time refill because she is currently living outside of [facility].   She wants her medications sent to [facility] [address] [place], [place], [place] [address].  [name]'

## 2020-03-09 ENCOUNTER — Ambulatory Visit: Payer: Self-pay

## 2020-03-24 ENCOUNTER — Other Ambulatory Visit: Payer: Self-pay | Admitting: Gerontology

## 2020-03-24 DIAGNOSIS — Z7901 Long term (current) use of anticoagulants: Secondary | ICD-10-CM

## 2020-04-24 ENCOUNTER — Other Ambulatory Visit: Payer: Self-pay

## 2020-06-13 ENCOUNTER — Other Ambulatory Visit: Payer: Self-pay

## 2020-06-13 MED ORDER — ELIQUIS 5 MG PO TABS
ORAL_TABLET | Freq: Two times a day (BID) | ORAL | 3 refills | Status: AC
  Filled 2020-06-13: qty 90, 90d supply, fill #0

## 2020-07-19 ENCOUNTER — Other Ambulatory Visit: Payer: Self-pay

## 2020-08-11 ENCOUNTER — Other Ambulatory Visit: Payer: Self-pay

## 2021-01-09 ENCOUNTER — Other Ambulatory Visit: Payer: Self-pay

## 2021-07-01 ENCOUNTER — Other Ambulatory Visit: Payer: Self-pay
